# Patient Record
Sex: Female | Born: 1990 | Hispanic: Yes | Smoking: Never smoker
Health system: Southern US, Community
[De-identification: ages and names within clinical notes are randomized; demographics above are authoritative.]

## PROBLEM LIST (undated history)

## (undated) DIAGNOSIS — Z789 Other specified health status: Secondary | ICD-10-CM

## (undated) HISTORY — PX: NO PAST SURGERIES: SHX2092

## (undated) HISTORY — DX: Other specified health status: Z78.9

---

## 2014-01-20 ENCOUNTER — Other Ambulatory Visit: Payer: Self-pay | Admitting: Obstetrics & Gynecology

## 2014-01-20 DIAGNOSIS — O3680X0 Pregnancy with inconclusive fetal viability, not applicable or unspecified: Secondary | ICD-10-CM

## 2014-01-24 ENCOUNTER — Ambulatory Visit (INDEPENDENT_AMBULATORY_CARE_PROVIDER_SITE_OTHER): Payer: Medicaid Other

## 2014-01-24 DIAGNOSIS — O3680X0 Pregnancy with inconclusive fetal viability, not applicable or unspecified: Secondary | ICD-10-CM

## 2014-01-24 NOTE — Progress Notes (Signed)
U/S(9+0wks)-single IUP with +FCA noted, FHR-174 bpm, CRL c/w LMP dates, cx appears closed, bilateral adnexa appears WNL,

## 2014-02-01 ENCOUNTER — Encounter: Payer: Self-pay | Admitting: Women's Health

## 2014-02-01 ENCOUNTER — Ambulatory Visit (INDEPENDENT_AMBULATORY_CARE_PROVIDER_SITE_OTHER): Payer: Self-pay | Admitting: Women's Health

## 2014-02-01 VITALS — BP 128/78 | Ht 66.0 in | Wt 198.0 lb

## 2014-02-01 DIAGNOSIS — Z0189 Encounter for other specified special examinations: Secondary | ICD-10-CM

## 2014-02-01 DIAGNOSIS — Z1159 Encounter for screening for other viral diseases: Secondary | ICD-10-CM

## 2014-02-01 DIAGNOSIS — Z36 Encounter for antenatal screening of mother: Secondary | ICD-10-CM

## 2014-02-01 DIAGNOSIS — Z3401 Encounter for supervision of normal first pregnancy, first trimester: Secondary | ICD-10-CM

## 2014-02-01 DIAGNOSIS — Z1371 Encounter for nonprocreative screening for genetic disease carrier status: Secondary | ICD-10-CM

## 2014-02-01 DIAGNOSIS — Z130 Encounter for screening for diseases of the blood and blood-forming organs and certain disorders involving the immune mechanism: Secondary | ICD-10-CM

## 2014-02-01 DIAGNOSIS — Z0184 Encounter for antibody response examination: Secondary | ICD-10-CM

## 2014-02-01 DIAGNOSIS — Z1389 Encounter for screening for other disorder: Secondary | ICD-10-CM

## 2014-02-01 DIAGNOSIS — Z3400 Encounter for supervision of normal first pregnancy, unspecified trimester: Secondary | ICD-10-CM | POA: Insufficient documentation

## 2014-02-01 DIAGNOSIS — Z331 Pregnant state, incidental: Secondary | ICD-10-CM

## 2014-02-01 LAB — POCT URINALYSIS DIPSTICK
GLUCOSE UA: NEGATIVE
Ketones, UA: NEGATIVE
LEUKOCYTES UA: NEGATIVE
NITRITE UA: NEGATIVE
Protein, UA: NEGATIVE

## 2014-02-01 NOTE — Patient Instructions (Addendum)

## 2014-02-01 NOTE — Progress Notes (Signed)
  Subjective:  Bonnie Mayer is a 23 y.o. G1P0 Hispanic female at 6539w1d by LMP c/w 9wk u/s, being seen today for her first obstetrical visit.  Her obstetrical history is significant for primigravida.  Pregnancy history fully reviewed.  Patient reports n/v- declines meds at this time. Some dizziness and headaches. Had HAs prior to pregnancy, have become more frequent- hasn't tried any meds. Denies vb, cramping, uti s/s, abnormal/malodorous vag d/c, or vulvovaginal itching/irritation.  BP 128/78  Wt 198 lb (89.812 kg)  LMP 11/22/2013  HISTORY: OB History  Gravida Para Term Preterm AB SAB TAB Ectopic Multiple Living  1             # Outcome Date GA Lbr Len/2nd Weight Sex Delivery Anes PTL Lv  1 CUR              Past Medical History  Diagnosis Date  . Medical history non-contributory    Past Surgical History  Procedure Laterality Date  . No past surgeries     Family History  Problem Relation Age of Onset  . Diabetes Mother   . Hyperlipidemia Mother   . Hypertension Mother   . Cancer Paternal Uncle   . Alzheimer's disease Maternal Grandmother   . Diabetes Maternal Grandmother   . Hyperlipidemia Maternal Grandmother   . Hypertension Maternal Grandmother   . Diabetes Maternal Grandfather   . Hyperlipidemia Maternal Grandfather   . Hypertension Maternal Grandfather   . Cancer Paternal Grandmother     breast    Exam   System:     General: Well developed & nourished, no acute distress   Skin: Warm & dry, normal coloration and turgor, no rashes   Neurologic: Alert & oriented, normal mood   Cardiovascular: Regular rate & rhythm   Respiratory: Effort & rate normal, LCTAB, acyanotic   Abdomen: Soft, non tender   Extremities: normal strength, tone   [facility] per her report  FHR: 160  via informal u/s   Assessment:   Pregnancy: G1P0 Patient Active Problem List   Diagnosis Date Noted  . Supervision of normal first pregnancy 02/01/2014    Priority: High    139w1d G1P0 New OB visit N/V of pregnancy HAs Dizziness    Plan:  Self-pay, unable to pay for labs today- will try to come back before her next appt to have labs drawn. Applied for [employer] but was denied b/c she makes too much, doesn't have insurance w/ work- previously spoke w/ [facility] practitioner[facility] office staff- [facility] which we don't provide here Continue prenatal vitamins Problem list reviewed and updated Reviewed n/v relief measures and warning s/s to report, call back if decides she needs meds Reviewed recommended weight gain based on pre-gravid BMI Encouraged well-balanced diet Genetic Screening discussed Integrated Screen: requested, but self-pay, so decided to cancel Cystic fibrosis screening discussed declined- but self- pay, so decided to cancel Ultrasound discussed; fetal survey: requested Follow up in 4 weeks for visit [facility] completed [facility] prn HAs Increase po fluids, small frequent meals/snacks, change positions slowly  Marge DuncansBooker, Greydis Stlouis Randall CNM, WHNP-BC 02/01/2014 4:10 PM

## 2014-02-21 ENCOUNTER — Other Ambulatory Visit: Payer: Self-pay

## 2014-02-21 DIAGNOSIS — Z0184 Encounter for antibody response examination: Secondary | ICD-10-CM

## 2014-02-21 DIAGNOSIS — Z1389 Encounter for screening for other disorder: Secondary | ICD-10-CM

## 2014-02-21 DIAGNOSIS — Z118 Encounter for screening for other infectious and parasitic diseases: Secondary | ICD-10-CM

## 2014-02-21 DIAGNOSIS — Z331 Pregnant state, incidental: Secondary | ICD-10-CM

## 2014-02-21 DIAGNOSIS — Z0283 Encounter for blood-alcohol and blood-drug test: Secondary | ICD-10-CM

## 2014-02-21 DIAGNOSIS — Z113 Encounter for screening for infections with a predominantly sexual mode of transmission: Secondary | ICD-10-CM

## 2014-02-21 DIAGNOSIS — Z3401 Encounter for supervision of normal first pregnancy, first trimester: Secondary | ICD-10-CM

## 2014-02-21 DIAGNOSIS — Z130 Encounter for screening for diseases of the blood and blood-forming organs and certain disorders involving the immune mechanism: Secondary | ICD-10-CM

## 2014-02-21 DIAGNOSIS — Z1159 Encounter for screening for other viral diseases: Secondary | ICD-10-CM

## 2014-02-21 DIAGNOSIS — Z114 Encounter for screening for human immunodeficiency virus [HIV]: Secondary | ICD-10-CM

## 2014-02-21 NOTE — Addendum Note (Signed)
Addended by: Richardson ChiquitoRAVIS, [name] M on: 02/21/2014 02:55 PM   Modules accepted: Orders

## 2014-02-22 LAB — ABO AND RH: Rh Type: POSITIVE

## 2014-02-22 LAB — URINALYSIS
BILIRUBIN URINE: NEGATIVE
Glucose, UA: NEGATIVE mg/dL
Hgb urine dipstick: NEGATIVE
Ketones, ur: NEGATIVE mg/dL
Leukocytes, UA: NEGATIVE
NITRITE: NEGATIVE
PROTEIN: NEGATIVE mg/dL
SPECIFIC GRAVITY, URINE: 1.024 (ref 1.005–1.030)
UROBILINOGEN UA: 1 mg/dL (ref 0.0–1.0)
pH: 6 (ref 5.0–8.0)

## 2014-02-22 LAB — CBC
HEMATOCRIT: 39.2 % (ref 36.0–46.0)
Hemoglobin: 13 g/dL (ref 12.0–15.0)
MCH: 29.2 pg (ref 26.0–34.0)
MCHC: 33.2 g/dL (ref 30.0–36.0)
MCV: 88.1 fL (ref 78.0–100.0)
Platelets: 269 10*3/uL (ref 150–400)
RBC: 4.45 MIL/uL (ref 3.87–5.11)
RDW: 13.8 % (ref 11.5–15.5)
WBC: 8.3 10*3/uL (ref 4.0–10.5)

## 2014-02-22 LAB — HEPATITIS B SURFACE ANTIGEN: HEP B S AG: NEGATIVE

## 2014-02-22 LAB — ANTIBODY SCREEN: Antibody Screen: NEGATIVE

## 2014-02-22 LAB — RUBELLA SCREEN: RUBELLA: 0.73 {index} (ref <0.90)

## 2014-02-22 LAB — RPR

## 2014-02-22 LAB — HIV ANTIBODY (ROUTINE TESTING W REFLEX): HIV: NONREACTIVE

## 2014-02-22 LAB — GC/CHLAMYDIA PROBE AMP
CT PROBE, AMP APTIMA: NEGATIVE
GC PROBE AMP APTIMA: NEGATIVE

## 2014-02-22 LAB — VARICELLA ZOSTER ANTIBODY, IGG: Varicella IgG: 738.6 Index — ABNORMAL HIGH (ref <135.00)

## 2014-02-22 LAB — SICKLE CELL SCREEN: SICKLE CELL SCREEN: NEGATIVE

## 2014-02-22 NOTE — Addendum Note (Signed)
Addended by: Gaylyn RongEVANS, Raife Lizer A on: 02/22/2014 10:21 AM   Modules accepted: Orders

## 2014-03-01 ENCOUNTER — Ambulatory Visit (INDEPENDENT_AMBULATORY_CARE_PROVIDER_SITE_OTHER): Payer: Self-pay | Admitting: Women's Health

## 2014-03-01 ENCOUNTER — Encounter: Payer: Self-pay | Admitting: Women's Health

## 2014-03-01 VITALS — BP 112/68 | Wt 199.0 lb

## 2014-03-01 DIAGNOSIS — Z3400 Encounter for supervision of normal first pregnancy, unspecified trimester: Secondary | ICD-10-CM

## 2014-03-01 DIAGNOSIS — O9989 Other specified diseases and conditions complicating pregnancy, childbirth and the puerperium: Secondary | ICD-10-CM

## 2014-03-01 DIAGNOSIS — Z331 Pregnant state, incidental: Secondary | ICD-10-CM

## 2014-03-01 DIAGNOSIS — Z283 Underimmunization status: Secondary | ICD-10-CM

## 2014-03-01 DIAGNOSIS — Z3402 Encounter for supervision of normal first pregnancy, second trimester: Secondary | ICD-10-CM

## 2014-03-01 DIAGNOSIS — O09899 Supervision of other high risk pregnancies, unspecified trimester: Secondary | ICD-10-CM | POA: Insufficient documentation

## 2014-03-01 DIAGNOSIS — Z363 Encounter for antenatal screening for malformations: Secondary | ICD-10-CM

## 2014-03-01 DIAGNOSIS — Z1389 Encounter for screening for other disorder: Secondary | ICD-10-CM

## 2014-03-01 LAB — POCT URINALYSIS DIPSTICK
Blood, UA: NEGATIVE
GLUCOSE UA: NEGATIVE
Ketones, UA: NEGATIVE
LEUKOCYTES UA: NEGATIVE
NITRITE UA: NEGATIVE
Protein, UA: NEGATIVE

## 2014-03-01 MED ORDER — PRENATAL 27-0.8 MG PO TABS: 1.0000 | ORAL_TABLET | Freq: Every day | ORAL | Status: AC

## 2014-03-01 NOTE — Progress Notes (Signed)
Low-risk OB appointment G1P0 1029w1d Estimated Date of Delivery: 08/29/14 BP 112/68  Wt 199 lb (90.266 kg)  LMP 11/22/2013  BP, weight, and urine reviewed.  Refer to obstetrical flow sheet for FH & FHR.  No fm yet. Denies cramping, lof, vb, or uti s/s. No complaints. Reviewed warning s/s to report, pn1 labs. Plan:  Continue routine obstetrical care  F/U in 4-5wks for OB appointment and anatomy u/s

## 2014-03-01 NOTE — Patient Instructions (Signed)
Second Trimester of Pregnancy The second trimester is from week 13 through week 28, months 4 through 6. The second trimester is often a time when you feel your best. Your body has also adjusted to being pregnant, and you begin to feel better physically. Usually, morning sickness has lessened or quit completely, you may have more energy, and you may have an increase in appetite. The second trimester is also a time when the fetus is growing rapidly. At the end of the sixth month, the fetus is about 9 inches long and weighs about 1 pounds. You will likely begin to feel the baby move (quickening) between 18 and 20 weeks of the pregnancy. BODY CHANGES Your body goes through many changes during pregnancy. The changes vary from woman to woman.   Your weight will continue to increase. You will notice your lower abdomen bulging out.  You may begin to get stretch marks on your hips, abdomen, and breasts.  You may develop headaches that can be relieved by medicines approved by your health care provider.  You may urinate more often because the fetus is pressing on your bladder.  You may develop or continue to have heartburn as a result of your pregnancy.  You may develop constipation because certain hormones are causing the muscles that push waste through your intestines to slow down.  You may develop hemorrhoids or swollen, bulging veins (varicose veins).  You may have back pain because of the weight gain and pregnancy hormones relaxing your joints between the bones in your pelvis and as a result of a shift in weight and the muscles that support your balance.  Your breasts will continue to grow and be tender.  Your gums may bleed and may be sensitive to brushing and flossing.  Dark spots or blotches (chloasma, mask of pregnancy) may develop on your face. This will likely fade after the baby is born.  A dark line from your belly button to the pubic area (linea nigra) may appear. This will likely fade  after the baby is born.  You may have changes in your hair. These can include thickening of your hair, rapid growth, and changes in texture. Some women also have hair loss during or after pregnancy, or hair that feels dry or thin. Your hair will most likely return to normal after your baby is born. WHAT TO EXPECT AT YOUR PRENATAL VISITS During a routine prenatal visit:  You will be weighed to make sure you and the fetus are growing normally.  Your blood pressure will be taken.  Your abdomen will be measured to track your baby's growth.  The fetal heartbeat will be listened to.  Any test results from the previous visit will be discussed. Your health care provider may ask you:  How you are feeling.  If you are feeling the baby move.  If you have had any abnormal symptoms, such as leaking fluid, bleeding, severe headaches, or abdominal cramping.  If you have any questions. Other tests that may be performed during your second trimester include:  Blood tests that check for:  Low iron levels (anemia).  Gestational diabetes (between 24 and 28 weeks).  Rh antibodies.  Urine tests to check for infections, diabetes, or protein in the urine.  An ultrasound to confirm the proper growth and development of the baby.  An amniocentesis to check for possible genetic problems.  Fetal screens for spina bifida and Down syndrome. HOME CARE INSTRUCTIONS   Avoid all smoking, herbs, alcohol, and unprescribed   drugs. These chemicals affect the formation and growth of the baby.  Follow your health care provider's instructions regarding medicine use. There are medicines that are either safe or unsafe to take during pregnancy.  Exercise only as directed by your health care provider. Experiencing uterine cramps is a good sign to stop exercising.  Continue to eat regular, healthy meals.  Wear a good support bra for breast tenderness.  Do not use hot tubs, steam rooms, or saunas.  Wear your  seat belt at all times when driving.  Avoid raw meat, uncooked cheese, cat litter boxes, and soil used by cats. These carry germs that can cause birth defects in the baby.  Take your prenatal vitamins.  Try taking a stool softener (if your health care provider approves) if you develop constipation. Eat more high-fiber foods, such as fresh vegetables or fruit and whole grains. Drink plenty of fluids to keep your urine clear or pale yellow.  Take warm sitz baths to soothe any pain or discomfort caused by hemorrhoids. Use hemorrhoid cream if your health care provider approves.  If you develop varicose veins, wear support hose. Elevate your feet for 15 minutes, 3-4 times a day. Limit salt in your diet.  Avoid heavy lifting, wear low heel shoes, and practice good posture.  Rest with your legs elevated if you have leg cramps or low back pain.  Visit your dentist if you have not gone yet during your pregnancy. Use a soft toothbrush to brush your teeth and be gentle when you floss.  A sexual relationship may be continued unless your health care provider directs you otherwise.  Continue to go to all your prenatal visits as directed by your health care provider. SEEK MEDICAL CARE IF:   You have dizziness.  You have mild pelvic cramps, pelvic pressure, or nagging pain in the abdominal area.  You have persistent nausea, vomiting, or diarrhea.  You have a bad smelling vaginal discharge.  You have pain with urination. SEEK IMMEDIATE MEDICAL CARE IF:   You have a fever.  You are leaking fluid from your vagina.  You have spotting or bleeding from your vagina.  You have severe abdominal cramping or pain.  You have rapid weight gain or loss.  You have shortness of breath with chest pain.  You notice sudden or extreme swelling of your face, hands, ankles, feet, or legs.  You have not felt your baby move in over an hour.  You have severe headaches that do not go away with  medicine.  You have vision changes. Document Released: 04/16/2001 Document Revised: 04/27/2013 Document Reviewed: 06/23/2012 [employer] Patient Information 2015 [employer]. This information is not intended to replace advice given to you by your health care provider. Make sure you discuss any questions you have with your health care provider.  

## 2014-03-08 ENCOUNTER — Encounter: Payer: Self-pay | Admitting: Women's Health

## 2014-04-05 ENCOUNTER — Ambulatory Visit (INDEPENDENT_AMBULATORY_CARE_PROVIDER_SITE_OTHER): Payer: Self-pay | Admitting: Women's Health

## 2014-04-05 ENCOUNTER — Other Ambulatory Visit: Payer: Self-pay

## 2014-04-05 ENCOUNTER — Other Ambulatory Visit: Payer: Self-pay | Admitting: Women's Health

## 2014-04-05 ENCOUNTER — Ambulatory Visit (INDEPENDENT_AMBULATORY_CARE_PROVIDER_SITE_OTHER): Payer: Medicaid Other

## 2014-04-05 ENCOUNTER — Encounter: Payer: Self-pay | Admitting: Women's Health

## 2014-04-05 VITALS — BP 122/80 | Wt 203.0 lb

## 2014-04-05 DIAGNOSIS — O358XX1 Maternal care for other (suspected) fetal abnormality and damage, fetus 1: Secondary | ICD-10-CM

## 2014-04-05 DIAGNOSIS — Z1389 Encounter for screening for other disorder: Secondary | ICD-10-CM

## 2014-04-05 DIAGNOSIS — Z363 Encounter for antenatal screening for malformations: Secondary | ICD-10-CM

## 2014-04-05 DIAGNOSIS — Z36 Encounter for antenatal screening of mother: Secondary | ICD-10-CM

## 2014-04-05 DIAGNOSIS — Z331 Pregnant state, incidental: Secondary | ICD-10-CM

## 2014-04-05 DIAGNOSIS — O35BXX1 Maternal care for other (suspected) fetal abnormality and damage, fetal cardiac anomalies, fetus 1: Secondary | ICD-10-CM

## 2014-04-05 DIAGNOSIS — Z3402 Encounter for supervision of normal first pregnancy, second trimester: Secondary | ICD-10-CM

## 2014-04-05 LAB — POCT URINALYSIS DIPSTICK
GLUCOSE UA: NEGATIVE
Ketones, UA: NEGATIVE
Leukocytes, UA: NEGATIVE
NITRITE UA: NEGATIVE
Protein, UA: NEGATIVE
RBC UA: NEGATIVE

## 2014-04-05 NOTE — Patient Instructions (Addendum)
If you travel, make sure you get out of the car every hour to walk around to help prevent blood clots. If you are having any cramping, bleeding, leakage of fluid- do not go, call us or go to hospital.   [place] [facility]:  [facility] [phone]            [facility] [phone]                 [facility] [phone] (usually doesn't accept new patients unless you have family there already, you are always welcome to call and ask)             [facility] ([address] [place]) [phone]   [facility]:   [facility]: [phone]  [facility]: [phone]   Second Trimester of Pregnancy The second trimester is from week 13 through week 28, months 4 through 6. The second trimester is often a time when you feel your best. Your body has also adjusted to being pregnant, and you begin to feel better physically. Usually, morning sickness has lessened or quit completely, you may have more energy, and you may have an increase in appetite. The second trimester is also a time when the fetus is growing rapidly. At the end of the sixth month, the fetus is about 9 inches long and weighs about 1 pounds. You will likely begin to feel the baby move (quickening) between 18 and 20 weeks of the pregnancy. BODY CHANGES Your body goes through many changes during pregnancy. The changes vary from woman to woman.   Your weight will continue to increase. You will notice your lower abdomen bulging out.  You may begin to get stretch marks on your hips, abdomen, and breasts.  You may develop headaches that can be relieved by medicines approved by your health care provider.  You may urinate more often because the fetus is pressing on your bladder.  You may develop or continue to have heartburn as a result of your pregnancy.  You may develop constipation because certain hormones are  causing the muscles that push waste through your intestines to slow down.  You may develop hemorrhoids or swollen, bulging veins (varicose veins).  You may have back pain because of the weight gain and pregnancy hormones relaxing your joints between the bones in your pelvis and as a result of a shift in weight and the muscles that support your balance.  Your breasts will continue to grow and be tender.  Your gums may bleed and may be sensitive to brushing and flossing.  Dark spots or blotches (chloasma, mask of pregnancy) may develop on your face. This will likely fade after the baby is born.  A dark line from your belly button to the pubic area (linea nigra) may appear. This will likely fade after the baby is born.  You may have changes in your hair. These can include thickening of your hair, rapid growth, and changes in texture. Some women also have hair loss during or after pregnancy, or hair that feels dry or thin. Your hair will most likely return to normal after your baby is born. WHAT TO EXPECT AT YOUR PRENATAL VISITS During a routine prenatal visit:  You will be weighed to make sure you and the fetus are growing normally.  Your blood pressure will be taken.  Your abdomen will be measured to track your baby's growth.  The fetal heartbeat will be listened to.  Any test results from the previous visit will be discussed. Your health care provider may ask you:  How you are feeling.  If you are feeling the baby move.  If you have had any abnormal symptoms, such as leaking fluid, bleeding, severe headaches, or abdominal cramping.  If you have any questions. Other tests that may be performed during your second trimester include:  Blood tests that check for:  Low iron levels (anemia).  Gestational diabetes (between 24 and 28 weeks).  Rh antibodies.  Urine tests to check for infections, diabetes, or protein in the urine.  An ultrasound to confirm the proper growth and  development of the baby.  An amniocentesis to check for possible genetic problems.  Fetal screens for spina bifida and Down syndrome. HOME CARE INSTRUCTIONS   Avoid all smoking, herbs, alcohol, and unprescribed drugs. These chemicals affect the formation and growth of the baby.  Follow your health care provider's instructions regarding medicine use. There are medicines that are either safe or unsafe to take during pregnancy.  Exercise only as directed by your health care provider. Experiencing uterine cramps is a good sign to stop exercising.  Continue to eat regular, healthy meals.  Wear a good support bra for breast tenderness.  Do not use hot tubs, steam rooms, or saunas.  Wear your seat belt at all times when driving.  Avoid raw meat, uncooked cheese, cat litter boxes, and soil used by cats. These carry germs that can cause birth defects in the baby.  Take your prenatal vitamins.  Try taking a stool softener (if your health care provider approves) if you develop constipation. Eat more high-fiber foods, such as fresh vegetables or fruit and whole grains. Drink plenty of fluids to keep your urine clear or pale yellow.  Take warm sitz baths to soothe any pain or discomfort caused by hemorrhoids. Use hemorrhoid cream if your health care provider approves.  If you develop varicose veins, wear support hose. Elevate your feet for 15 minutes, 3-4 times a day. Limit salt in your diet.  Avoid heavy lifting, wear low heel shoes, and practice good posture.  Rest with your legs elevated if you have leg cramps or low back pain.  Visit your dentist if you have not gone yet during your pregnancy. Use a soft toothbrush to brush your teeth and be gentle when you floss.  A sexual relationship may be continued unless your health care provider directs you otherwise.  Continue to go to all your prenatal visits as directed by your health care provider. SEEK MEDICAL CARE IF:   You have  dizziness.  You have mild pelvic cramps, pelvic pressure, or nagging pain in the abdominal area.  You have persistent nausea, vomiting, or diarrhea.  You have a bad smelling vaginal discharge.  You have pain with urination. SEEK IMMEDIATE MEDICAL CARE IF:   You have a fever.  You are leaking fluid from your vagina.  You have spotting or bleeding from your vagina.  You have severe abdominal cramping or pain.  You have rapid weight gain or loss.  You have shortness of breath with chest pain.  You notice sudden or extreme swelling of your face, hands, ankles, feet, or legs.  You have not felt your baby move in over an hour.  You have severe headaches that do not go away with medicine.  You have vision changes. Document Released: 04/16/2001 Document Revised: 04/27/2013 Document Reviewed: 06/23/2012 [facility] Patient Information 2015 [place], [place]. This information is not  intended to replace advice given to you by your health care provider. Make sure you discuss any questions you have with your health care provider.

## 2014-04-05 NOTE — Progress Notes (Signed)
U/S(19+1wks)-active fetus, meas c/w dates, fluid WNL, fundal Gr 0 placenta, cx appears closed (3.7cm), bilateral adnexa appears WNL,FHR-141 bpm, female fetus, LT Vent EICF noted no other major abnl noted

## 2014-04-05 NOTE — Progress Notes (Signed)
Low-risk OB appointment G1P0 10922w1d Estimated Date of Delivery: 08/29/14 BP 122/80 mmHg  Wt 203 lb (92.08 kg)  LMP 11/22/2013  BP, weight, and urine reviewed.  Refer to obstetrical flow sheet for FH & FHR.  Reports good fm.  Denies regular uc's, lof, vb, or uti s/s. No complaints. Planning on traveling to [place] via car for Christmas for ~1wk. To take copy of records. Do not go if sx ptl. Get out q 1hr to walk, do leg exercises while in car.  Reviewed today's anatomy u/s including Lt EICF- will recheck @ 28wks. Discussed ptl s/s, fm.  Plan:  Continue routine obstetrical care  F/U in 4wks for OB appointment

## 2014-05-06 NOTE — L&D Delivery Note (Cosign Needed)
Delivery Note After a 2 hour 2nd stage, At 1:00 AM a viable female was delivered via Vaginal, Spontaneous Delivery (Presentation: ;LAO  ).  APGAR: 8, 9; weight pending.  40 units of pitocin diluted in 1000cc LR was infused rapidly IV.  THe pt started bleeding and the placenta still had not separated. Cytotec 800mcg was given PR. The placenta separated spontaneously and delivered via CCT and maternal pushing effort.  It was inspected and appears to be intact with a 3 VC. The fundus was slow to firm despite massage, so methergine 0.2mg  was given IM (BP recorded was with arm bent; recheck before administration normal).   Anesthesia: Epidural  Episiotomy:  none Lacerations:  1st degree perineal Suture Repair: 2.0 vicryl Est. Blood Loss (mL):  500  Mom to postpartum.  Baby to Couplet care / Skin to Skin.  CRESENZO-DISHMAN,Jasper Hanf 08/19/2014, 1:30 AM

## 2014-05-09 ENCOUNTER — Ambulatory Visit (INDEPENDENT_AMBULATORY_CARE_PROVIDER_SITE_OTHER): Payer: Medicaid Other | Admitting: Women's Health

## 2014-05-09 ENCOUNTER — Encounter: Payer: Self-pay | Admitting: Women's Health

## 2014-05-09 VITALS — BP 120/80 | Wt 210.0 lb

## 2014-05-09 DIAGNOSIS — Z1389 Encounter for screening for other disorder: Secondary | ICD-10-CM

## 2014-05-09 DIAGNOSIS — O283 Abnormal ultrasonic finding on antenatal screening of mother: Secondary | ICD-10-CM

## 2014-05-09 DIAGNOSIS — Z331 Pregnant state, incidental: Secondary | ICD-10-CM

## 2014-05-09 DIAGNOSIS — Z3402 Encounter for supervision of normal first pregnancy, second trimester: Secondary | ICD-10-CM

## 2014-05-09 LAB — POCT URINALYSIS DIPSTICK
GLUCOSE UA: NEGATIVE
KETONES UA: NEGATIVE
LEUKOCYTES UA: NEGATIVE
NITRITE UA: NEGATIVE

## 2014-05-09 NOTE — Patient Instructions (Signed)
You will have your sugar test next visit.  Please do not eat or drink anything after midnight the night before you come, not even water.  You will be here for at least two hours.     Call the office ([phone]) or go to [facility] if:  You begin to have strong, frequent contractions  Your water breaks.  Sometimes it is a big gush of fluid, sometimes it is just a trickle that keeps getting your panties wet or running down your legs  You have vaginal bleeding.  It is normal to have a small amount of spotting if your cervix was checked.   You don't feel your baby moving like normal.  If you don't, get you something to eat and drink and lay down and focus on feeling your baby move.   If your baby is still not moving like normal, you should call the office or go to [facility].  Second Trimester of Pregnancy The second trimester is from week 13 through week 28, months 4 through 6. The second trimester is often a time when you feel your best. Your body has also adjusted to being pregnant, and you begin to feel better physically. Usually, morning sickness has lessened or quit completely, you may have more energy, and you may have an increase in appetite. The second trimester is also a time when the fetus is growing rapidly. At the end of the sixth month, the fetus is about 9 inches long and weighs about 1 pounds. You will likely begin to feel the baby move (quickening) between 18 and 20 weeks of the pregnancy. BODY CHANGES Your body goes through many changes during pregnancy. The changes vary from woman to woman.   Your weight will continue to increase. You will notice your lower abdomen bulging out.  You may begin to get stretch marks on your hips, abdomen, and breasts.  You may develop headaches that can be relieved by medicines approved by your health care provider.  You may urinate more often because the fetus is pressing on your bladder.  You may develop or continue to have  heartburn as a result of your pregnancy.  You may develop constipation because certain hormones are causing the muscles that push waste through your intestines to slow down.  You may develop hemorrhoids or swollen, bulging veins (varicose veins).  You may have back pain because of the weight gain and pregnancy hormones relaxing your joints between the bones in your pelvis and as a result of a shift in weight and the muscles that support your balance.  Your breasts will continue to grow and be tender.  Your gums may bleed and may be sensitive to brushing and flossing.  Dark spots or blotches (chloasma, mask of pregnancy) may develop on your face. This will likely fade after the baby is born.  A dark line from your belly button to the pubic area (linea nigra) may appear. This will likely fade after the baby is born.  You may have changes in your hair. These can include thickening of your hair, rapid growth, and changes in texture. Some women also have hair loss during or after pregnancy, or hair that feels dry or thin. Your hair will most likely return to normal after your baby is born. WHAT TO EXPECT AT YOUR PRENATAL VISITS During a routine prenatal visit:  You will be weighed to make sure you and the fetus are growing normally.  Your blood pressure will be taken.    Your abdomen will be measured to track your baby's growth.  The fetal heartbeat will be listened to.  Any test results from the previous visit will be discussed. Your health care provider may ask you:  How you are feeling.  If you are feeling the baby move.  If you have had any abnormal symptoms, such as leaking fluid, bleeding, severe headaches, or abdominal cramping.  If you have any questions. Other tests that may be performed during your second trimester include:  Blood tests that check for:  Low iron levels (anemia).  Gestational diabetes (between 24 and 28 weeks).  Rh antibodies.  Urine tests to check  for infections, diabetes, or protein in the urine.  An ultrasound to confirm the proper growth and development of the baby.  An amniocentesis to check for possible genetic problems.  Fetal screens for spina bifida and Down syndrome. HOME CARE INSTRUCTIONS   Avoid all smoking, herbs, alcohol, and unprescribed drugs. These chemicals affect the formation and growth of the baby.  Follow your health care provider's instructions regarding medicine use. There are medicines that are either safe or unsafe to take during pregnancy.  Exercise only as directed by your health care provider. Experiencing uterine cramps is a good sign to stop exercising.  Continue to eat regular, healthy meals.  Wear a good support bra for breast tenderness.  Do not use hot tubs, steam rooms, or saunas.  Wear your seat belt at all times when driving.  Avoid raw meat, uncooked cheese, cat litter boxes, and soil used by cats. These carry germs that can cause birth defects in the baby.  Take your prenatal vitamins.  Try taking a stool softener (if your health care provider approves) if you develop constipation. Eat more high-fiber foods, such as fresh vegetables or fruit and whole grains. Drink plenty of fluids to keep your urine clear or pale yellow.  Take warm sitz baths to soothe any pain or discomfort caused by hemorrhoids. Use hemorrhoid cream if your health care provider approves.  If you develop varicose veins, wear support hose. Elevate your feet for 15 minutes, 3-4 times a day. Limit salt in your diet.  Avoid heavy lifting, wear low heel shoes, and practice good posture.  Rest with your legs elevated if you have leg cramps or low back pain.  Visit your dentist if you have not gone yet during your pregnancy. Use a soft toothbrush to brush your teeth and be gentle when you floss.  A sexual relationship may be continued unless your health care provider directs you otherwise.  Continue to go to all your  prenatal visits as directed by your health care provider. SEEK MEDICAL CARE IF:   You have dizziness.  You have mild pelvic cramps, pelvic pressure, or nagging pain in the abdominal area.  You have persistent nausea, vomiting, or diarrhea.  You have a bad smelling vaginal discharge.  You have pain with urination. SEEK IMMEDIATE MEDICAL CARE IF:   You have a fever.  You are leaking fluid from your vagina.  You have spotting or bleeding from your vagina.  You have severe abdominal cramping or pain.  You have rapid weight gain or loss.  You have shortness of breath with chest pain.  You notice sudden or extreme swelling of your face, hands, ankles, feet, or legs.  You have not felt your baby move in over an hour.  You have severe headaches that do not go away with medicine.  You have vision changes.   Document Released: 04/16/2001 Document Revised: 04/27/2013 Document Reviewed: 06/23/2012 Exit[employer] Patient Information 2015 [employer]. This information is not intended to replace advice given to you by your health care provider. Make sure you discuss any questions you have with your health care provider.     

## 2014-05-09 NOTE — Progress Notes (Signed)
Low-risk OB appointment G1P0 [redacted]w[redacted]d Estimated Date of Delivery: 08/29/14 BP 120/80 mmHg  Wt 210 lb (95.255 kg)  LMP 11/22/2013  BP, weight, and urine reviewed.  Refer to obstetrical flow sheet for FH & FHR.  Reports good fm.  Denies regular uc's, lof, vb, or uti s/s. No complaints. Reviewed ptl s/s, fm. Plan:  Continue routine obstetrical care  F/U in 4wks for OB appointment, pn2, and repeat u/s for Lt EICF

## 2014-06-06 ENCOUNTER — Ambulatory Visit (INDEPENDENT_AMBULATORY_CARE_PROVIDER_SITE_OTHER): Payer: Medicaid Other | Admitting: Obstetrics and Gynecology

## 2014-06-06 ENCOUNTER — Encounter: Payer: Self-pay | Admitting: Obstetrics and Gynecology

## 2014-06-06 ENCOUNTER — Other Ambulatory Visit: Payer: Medicaid Other

## 2014-06-06 ENCOUNTER — Ambulatory Visit (INDEPENDENT_AMBULATORY_CARE_PROVIDER_SITE_OTHER): Payer: Medicaid Other

## 2014-06-06 DIAGNOSIS — Z331 Pregnant state, incidental: Secondary | ICD-10-CM

## 2014-06-06 DIAGNOSIS — Z113 Encounter for screening for infections with a predominantly sexual mode of transmission: Secondary | ICD-10-CM

## 2014-06-06 DIAGNOSIS — O283 Abnormal ultrasonic finding on antenatal screening of mother: Secondary | ICD-10-CM

## 2014-06-06 DIAGNOSIS — Z3403 Encounter for supervision of normal first pregnancy, third trimester: Secondary | ICD-10-CM

## 2014-06-06 DIAGNOSIS — Z1389 Encounter for screening for other disorder: Secondary | ICD-10-CM

## 2014-06-06 DIAGNOSIS — Z114 Encounter for screening for human immunodeficiency virus [HIV]: Secondary | ICD-10-CM

## 2014-06-06 DIAGNOSIS — Z0184 Encounter for antibody response examination: Secondary | ICD-10-CM

## 2014-06-06 DIAGNOSIS — Z131 Encounter for screening for diabetes mellitus: Secondary | ICD-10-CM

## 2014-06-06 LAB — POCT URINALYSIS DIPSTICK
Glucose, UA: NEGATIVE
Ketones, UA: NEGATIVE
Leukocytes, UA: NEGATIVE
Nitrite, UA: NEGATIVE
PROTEIN UA: NEGATIVE
RBC UA: NEGATIVE

## 2014-06-06 NOTE — Progress Notes (Signed)
U/S(28+0wks)-vtx active fetus, EFW 2 lb 12 oz (60th%tile), fluid WNL, fundal Gr 1 placenta, female fetus, FHR- 153 bpm, Lt Vent EICF remains, although all other cardiac structures appear  WNL

## 2014-06-06 NOTE — Progress Notes (Signed)
Pt denies any problems or concerns at this time.  

## 2014-06-06 NOTE — Progress Notes (Signed)
Patient ID: Bonnie Mayer, female   DOB: [birth year], 24 y.o.   MRN: [id number] G1P0 1667w0d Estimated Date of Delivery: 08/29/14  Blood pressure 120/80, weight 215 lb (97.523 kg), last menstrual period 11/22/2013.   refer to the ob flow sheet for FH and FHR, also BP, Wt, Urine results: negative  Patient reports +good fetal movement, denies any bleeding and no rupture of membranes symptoms or regular contractions. Patient complaints: She does not have any complaints at this time.  FH - 29cm FHR -153  Questions were answered. Assessment: 3967w0d, G1P0  Plan:  Continued routine obstetrical care F/u in 4 weeks   This chart was scribed for Bonnie BurrowJohn Samantha Olivera V, MD by Carl Bestelina Holson, Bonnie Mayer. This patient was seen in Room 1 and the patient's care was started at 10:27 AM.

## 2014-06-08 ENCOUNTER — Encounter: Payer: Self-pay | Admitting: Women's Health

## 2014-06-08 DIAGNOSIS — O283 Abnormal ultrasonic finding on antenatal screening of mother: Secondary | ICD-10-CM | POA: Insufficient documentation

## 2014-06-08 LAB — CBC
HCT: 35.7 % (ref 34.0–46.6)
HEMOGLOBIN: 12 g/dL (ref 11.1–15.9)
MCH: 30.5 pg (ref 26.6–33.0)
MCHC: 33.6 g/dL (ref 31.5–35.7)
MCV: 91 fL (ref 79–97)
Platelets: 244 10*3/uL (ref 150–379)
RBC: 3.94 x10E6/uL (ref 3.77–5.28)
RDW: 14 % (ref 12.3–15.4)
WBC: 10.4 10*3/uL (ref 3.4–10.8)

## 2014-06-08 LAB — GLUCOSE TOLERANCE, 2 HOURS W/ 1HR
Glucose, 1 hour: 148 mg/dL (ref 65–179)
Glucose, 2 hour: 91 mg/dL (ref 65–152)
Glucose, Fasting: 80 mg/dL (ref 65–91)

## 2014-06-08 LAB — HIV ANTIBODY (ROUTINE TESTING W REFLEX): HIV SCREEN 4TH GENERATION: NONREACTIVE

## 2014-06-08 LAB — HSV 2 ANTIBODY, IGG: HSV 2 IGG,TYPE SPECIFIC AB: <0.91 index (ref 0.00–0.90)

## 2014-06-08 LAB — RPR: RPR Ser Ql: NONREACTIVE

## 2014-06-08 LAB — ANTIBODY SCREEN: ANTIBODY SCREEN: NEGATIVE

## 2014-07-04 ENCOUNTER — Ambulatory Visit (INDEPENDENT_AMBULATORY_CARE_PROVIDER_SITE_OTHER): Payer: Medicaid Other | Admitting: Obstetrics and Gynecology

## 2014-07-04 ENCOUNTER — Encounter: Payer: Self-pay | Admitting: Obstetrics and Gynecology

## 2014-07-04 DIAGNOSIS — Z331 Pregnant state, incidental: Secondary | ICD-10-CM

## 2014-07-04 DIAGNOSIS — Z1389 Encounter for screening for other disorder: Secondary | ICD-10-CM

## 2014-07-04 DIAGNOSIS — Z3403 Encounter for supervision of normal first pregnancy, third trimester: Secondary | ICD-10-CM

## 2014-07-04 LAB — POCT URINALYSIS DIPSTICK
Blood, UA: NEGATIVE
Glucose, UA: NEGATIVE
Ketones, UA: NEGATIVE
Leukocytes, UA: NEGATIVE
NITRITE UA: NEGATIVE

## 2014-07-04 NOTE — Progress Notes (Signed)
Pt states that when she walks a lot she gets pain in her lower right groin area.

## 2014-07-04 NOTE — Progress Notes (Signed)
Patient ID: Bonnie Mayer, female   DOB: [birth year], 24 y.o.   MRN: [id number]  G1P0 3653w0d Estimated Date of Delivery: 08/29/14  Blood pressure 108/70, pulse 90, weight 222 lb 8 oz (100.925 kg), last menstrual period 11/22/2013.   refer to the ob flow sheet for FH and FHR, also BP, Wt, Urine results:notable for negative  Patient reports  + good fetal movement, denies any bleeding and no rupture of membranes symptoms or regular contractions. Patient complaints: Pt reports pain in right-sided suprapubic region with ambulation. She denies vaginal bleeding, vaginal discharge and contractions.   FH: 34 cm FHR: 162 bpm  Questions were answered. Assessment: 653w0d, G1P0  Plan:  Continued routine obstetrical care  F/u in 2 weeks for routine care   This chart was scribed for Tilda BurrowJohn Irena Gaydos V, MD by Gwenyth Oberatherine Macek, ED Scribe. This patient was seen in room 1 and the patient's care was started at 2:05 PM.   I personally performed the services described in this documentation, which was SCRIBED in my presence. The recorded information has been reviewed and considered accurate. It has been edited as necessary during review. Tilda BurrowFERGUSON,Devinne Epstein V, MD

## 2014-07-19 ENCOUNTER — Ambulatory Visit (INDEPENDENT_AMBULATORY_CARE_PROVIDER_SITE_OTHER): Payer: Medicaid Other | Admitting: Advanced Practice Midwife

## 2014-07-19 ENCOUNTER — Encounter: Payer: Self-pay | Admitting: Advanced Practice Midwife

## 2014-07-19 VITALS — BP 110/60 | HR 92 | Wt 226.0 lb

## 2014-07-19 DIAGNOSIS — Z1389 Encounter for screening for other disorder: Secondary | ICD-10-CM

## 2014-07-19 DIAGNOSIS — Z3403 Encounter for supervision of normal first pregnancy, third trimester: Secondary | ICD-10-CM

## 2014-07-19 DIAGNOSIS — Z331 Pregnant state, incidental: Secondary | ICD-10-CM

## 2014-07-19 LAB — POCT URINALYSIS DIPSTICK
Glucose, UA: NEGATIVE
Ketones, UA: NEGATIVE
Leukocytes, UA: NEGATIVE
Nitrite, UA: NEGATIVE
RBC UA: NEGATIVE

## 2014-07-19 NOTE — Progress Notes (Signed)
G1P0 79103w1d Estimated Date of Delivery: 08/29/14  Blood pressure 110/60, pulse 92, weight 226 lb (102.513 kg), last menstrual period 11/22/2013.   BP weight and urine results all reviewed and noted.  Please refer to the obstetrical flow sheet for the fundal height and fetal heart rate documentation:  Patient reports good fetal movement, denies any bleeding and no rupture of membranes symptoms or regular contractions. Patient is without complaints other than normal pregnancy complaints.  All questions were answered.  Plan:  Continued routine obstetrical care,   Follow up in 2 weeks for OB appointment,

## 2014-07-19 NOTE — Progress Notes (Signed)
Pt denies any problems or concerns at this time.  

## 2014-07-19 NOTE — Patient Instructions (Signed)

## 2014-08-02 ENCOUNTER — Ambulatory Visit (INDEPENDENT_AMBULATORY_CARE_PROVIDER_SITE_OTHER): Payer: Medicaid Other | Admitting: Women's Health

## 2014-08-02 VITALS — BP 110/70 | HR 76 | Wt 228.0 lb

## 2014-08-02 DIAGNOSIS — Z331 Pregnant state, incidental: Secondary | ICD-10-CM

## 2014-08-02 DIAGNOSIS — Z1389 Encounter for screening for other disorder: Secondary | ICD-10-CM

## 2014-08-02 DIAGNOSIS — Z3403 Encounter for supervision of normal first pregnancy, third trimester: Secondary | ICD-10-CM

## 2014-08-02 LAB — POCT URINALYSIS DIPSTICK
Blood, UA: NEGATIVE
Glucose, UA: NEGATIVE
KETONES UA: NEGATIVE
Leukocytes, UA: NEGATIVE
Nitrite, UA: NEGATIVE

## 2014-08-02 NOTE — Progress Notes (Signed)
Low-risk OB appointment G1P0 7279w1d Estimated Date of Delivery: 08/29/14 BP 110/70 mmHg  Pulse 76  Wt 228 lb (103.42 kg)  LMP 11/22/2013  BP, weight, and urine reviewed.  Refer to obstetrical flow sheet for FH & FHR.  Reports good fm.  Denies regular uc's, lof, vb, or uti s/s. No complaints. Reviewed ptl s/s, fkc. Plan:  Continue routine obstetrical care  F/U in 1wk for OB appointment and gbs

## 2014-08-02 NOTE — Patient Instructions (Addendum)
[place] Pediatricians/Family Doctors:  [facility] [phone]            [facility] [phone]                 [facility] [phone] (usually not accepting new patients unless you have family there already, you are always welcome to call and ask)            [facility] [phone] [address] [place]) [phone]   [facility] Pediatricians/Family Doctors:   [facility]: [phone]  [facility]: [phone]    Call the office [phone]) or go to [facility] if:  You begin to have strong, frequent contractions  Your water breaks.  Sometimes it is a big gush of fluid, sometimes it is just a trickle that keeps getting your panties wet or running down your legs  You have vaginal bleeding.  It is normal to have a small amount of spotting if your cervix was checked.   You don't feel your baby moving like normal.  If you don't, get you something to eat and drink and lay down and focus on feeling your baby move.  You should feel at least 10 movements in 2 hours.  If you don't, you should call the office or go to [facility].    Preterm Labor Information Preterm labor is when labor starts at less than 37 weeks of pregnancy. The normal length of a pregnancy is 39 to 41 weeks. CAUSES Often, there is no identifiable underlying cause as to why a woman goes into preterm labor. One of the most common known causes of preterm labor is infection. Infections of the uterus, cervix, vagina, amniotic sac, bladder, kidney, or even the lungs (pneumonia) can cause labor to start. Other suspected causes of preterm labor include:  6. Urogenital infections, such as yeast infections and bacterial vaginosis.  7. Uterine abnormalities (uterine shape, uterine septum, fibroids, or bleeding from the placenta).  8. A cervix that has been operated on (it may fail to stay closed).  9. Malformations in the fetus.   10. Multiple gestations (twins, triplets, and so on).  11. Breakage of the amniotic sac.  RISK FACTORS 2. Having a previous history of preterm labor.  3. Having premature rupture of membranes (PROM).  4. Having a placenta that covers the opening of the cervix (placenta previa).  5. Having a placenta that separates from the uterus (placental abruption).  6. Having a cervix that is too weak to hold the fetus in the uterus (incompetent cervix).  7. Having too much fluid in the amniotic sac (polyhydramnios).  8. Taking illegal drugs or smoking while pregnant.  9. Not gaining enough weight while pregnant.  10. Being younger than 45 and older than 26 years old.  11. Having a low socioeconomic status.  12. Being African American. SYMPTOMS Signs and symptoms of preterm labor include:  2. Menstrual-like cramps, abdominal pain, or back pain. 3. Uterine contractions that are regular, as frequent as six in an hour, regardless of their intensity (may be mild or painful). 4. Contractions that start on the top of the uterus and spread down to the lower abdomen and back.  5. A sense of increased pelvic pressure.  6. A watery or bloody mucus discharge that comes from the vagina.  TREATMENT Depending on the length of the pregnancy and other circumstances, your health care provider may suggest bed rest. If necessary, there are medicines that can be given to stop contractions  and to mature the fetal lungs. If labor happens before 34 weeks of pregnancy, a prolonged hospital stay may be recommended. Treatment depends on the condition of both you and the fetus.  WHAT SHOULD YOU DO IF YOU THINK YOU ARE IN PRETERM LABOR? Call your health care provider right away. You will need to go to the hospital to get checked immediately. HOW CAN YOU PREVENT PRETERM LABOR IN FUTURE PREGNANCIES? You should:  2. Stop smoking if you smoke. 3. Maintain healthy weight gain and avoid chemicals and drugs that are  not necessary. 4. Be watchful for any type of infection. 5. Inform your health care provider if you have a known history of preterm labor. Document Released: 07/13/2003 Document Revised: 12/23/2012 Document Reviewed: 05/25/2012 [facility] Patient Information 2015 [place], [place]. This information is not intended to replace advice given to you by your health care provider. Make sure you discuss any questions you have with your health care provider.

## 2014-08-09 ENCOUNTER — Ambulatory Visit (INDEPENDENT_AMBULATORY_CARE_PROVIDER_SITE_OTHER): Payer: Medicaid Other | Admitting: Obstetrics and Gynecology

## 2014-08-09 VITALS — BP 100/60 | HR 84 | Wt 229.0 lb

## 2014-08-09 DIAGNOSIS — Z369 Encounter for antenatal screening, unspecified: Secondary | ICD-10-CM

## 2014-08-09 DIAGNOSIS — Z3403 Encounter for supervision of normal first pregnancy, third trimester: Secondary | ICD-10-CM

## 2014-08-09 DIAGNOSIS — Z1389 Encounter for screening for other disorder: Secondary | ICD-10-CM

## 2014-08-09 DIAGNOSIS — Z331 Pregnant state, incidental: Secondary | ICD-10-CM

## 2014-08-09 LAB — POCT URINALYSIS DIPSTICK
Blood, UA: NEGATIVE
GLUCOSE UA: NEGATIVE
KETONES UA: NEGATIVE
LEUKOCYTES UA: NEGATIVE
NITRITE UA: NEGATIVE
Protein, UA: NEGATIVE

## 2014-08-09 LAB — OB RESULTS CONSOLE GC/CHLAMYDIA
Chlamydia: NEGATIVE
GC PROBE AMP, GENITAL: NEGATIVE

## 2014-08-09 LAB — OB RESULTS CONSOLE GBS: GBS: NEGATIVE

## 2014-08-09 NOTE — Patient Instructions (Signed)
kiFetal Movement Counts Patient Name: __________________________________________________ Patient Due Date: ____________________ Performing a fetal movement count is highly recommended in high-risk pregnancies, but it is good for every pregnant woman to do. Your health care provider may ask you to start counting fetal movements at 28 weeks of the pregnancy. Fetal movements often increase:  After eating a full meal.  After physical activity.  After eating or drinking something sweet or cold.  At rest. Pay attention to when you feel the baby is most active. This will help you notice a pattern of your baby's sleep and wake cycles and what factors contribute to an increase in fetal movement. It is important to perform a fetal movement count at the same time each day when your baby is normally most active.  HOW TO COUNT FETAL MOVEMENTS 1. Find a quiet and comfortable area to sit or lie down on your left side. Lying on your left side provides the best blood and oxygen circulation to your baby. 2. Write down the day and time on a sheet of paper or in a journal. 3. Start counting kicks, flutters, swishes, rolls, or jabs in a 2-hour period. You should feel at least 10 movements within 2 hours. 4. If you do not feel 10 movements in 2 hours, wait 2-3 hours and count again. Look for a change in the pattern or not enough counts in 2 hours. SEEK MEDICAL CARE IF:  You feel less than 10 counts in 2 hours, tried twice.  There is no movement in over an hour.  The pattern is changing or taking longer each day to reach 10 counts in 2 hours.  You feel the baby is not moving as he or she usually does. Date: ____________ Movements: ____________ Start time: ____________ Bonnie MartinFinish time: ____________  Date: ____________ Movements: ____________ Start time: ____________ Bonnie MartinFinish time: ____________ Date: ____________ Movements: ____________ Start time: ____________ Bonnie MartinFinish time: ____________ Date: ____________ Movements:  ____________ Start time: ____________ Bonnie MartinFinish time: ____________ Date: ____________ Movements: ____________ Start time: ____________ Bonnie MartinFinish time: ____________ Date: ____________ Movements: ____________ Start time: ____________ Bonnie MartinFinish time: ____________ Date: ____________ Movements: ____________ Start time: ____________ Bonnie MartinFinish time: ____________ Date: ____________ Movements: ____________ Start time: ____________ Bonnie MartinFinish time: ____________  Date: ____________ Movements: ____________ Start time: ____________ Bonnie MartinFinish time: ____________ Date: ____________ Movements: ____________ Start time: ____________ Bonnie MartinFinish time: ____________ Date: ____________ Movements: ____________ Start time: ____________ Bonnie MartinFinish time: ____________ Date: ____________ Movements: ____________ Start time: ____________ Bonnie MartinFinish time: ____________ Date: ____________ Movements: ____________ Start time: ____________ Bonnie MartinFinish time: ____________ Date: ____________ Movements: ____________ Start time: ____________ Bonnie MartinFinish time: ____________ Date: ____________ Movements: ____________ Start time: ____________ Bonnie MartinFinish time: ____________  Date: ____________ Movements: ____________ Start time: ____________ Bonnie MartinFinish time: ____________ Date: ____________ Movements: ____________ Start time: ____________ Bonnie MartinFinish time: ____________ Date: ____________ Movements: ____________ Start time: ____________ Bonnie MartinFinish time: ____________ Date: ____________ Movements: ____________ Start time: ____________ Bonnie MartinFinish time: ____________ Date: ____________ Movements: ____________ Start time: ____________ Bonnie MartinFinish time: ____________ Date: ____________ Movements: ____________ Start time: ____________ Bonnie MartinFinish time: ____________ Date: ____________ Movements: ____________ Start time: ____________ Bonnie MartinFinish time: ____________  Date: ____________ Movements: ____________ Start time: ____________ Bonnie MartinFinish time: ____________ Date: ____________ Movements: ____________ Start time: ____________ Bonnie MartinFinish  time: ____________ Date: ____________ Movements: ____________ Start time: ____________ Bonnie MartinFinish time: ____________ Date: ____________ Movements: ____________ Start time: ____________ Bonnie MartinFinish time: ____________ Date: ____________ Movements: ____________ Start time: ____________ Bonnie MartinFinish time: ____________ Date: ____________ Movements: ____________ Start time: ____________ Bonnie MartinFinish time: ____________ Date: ____________ Movements: ____________ Start time: ____________ Bonnie MartinFinish time: ____________  Date: ____________ Movements: ____________ Start time: ____________ Bonnie MartinFinish  time: ____________ Date: ____________ Movements: ____________ Start time: ____________ Bonnie Mayer time: ____________ Date: ____________ Movements: ____________ Start time: ____________ Bonnie Mayer time: ____________ Date: ____________ Movements: ____________ Start time: ____________ Bonnie Mayer time: ____________ Date: ____________ Movements: ____________ Start time: ____________ Bonnie Mayer time: ____________ Date: ____________ Movements: ____________ Start time: ____________ Bonnie Mayer time: ____________ Date: ____________ Movements: ____________ Start time: ____________ Bonnie Mayer time: ____________  Date: ____________ Movements: ____________ Start time: ____________ Bonnie Mayer time: ____________ Date: ____________ Movements: ____________ Start time: ____________ Bonnie Mayer time: ____________ Date: ____________ Movements: ____________ Start time: ____________ Bonnie Mayer time: ____________ Date: ____________ Movements: ____________ Start time: ____________ Bonnie Mayer time: ____________ Date: ____________ Movements: ____________ Start time: ____________ Bonnie Mayer time: ____________ Date: ____________ Movements: ____________ Start time: ____________ Bonnie Mayer time: ____________ Date: ____________ Movements: ____________ Start time: ____________ Bonnie Mayer time: ____________  Date: ____________ Movements: ____________ Start time: ____________ Bonnie Mayer time: ____________ Date: ____________  Movements: ____________ Start time: ____________ Bonnie Mayer time: ____________ Date: ____________ Movements: ____________ Start time: ____________ Bonnie Mayer time: ____________ Date: ____________ Movements: ____________ Start time: ____________ Bonnie Mayer time: ____________ Date: ____________ Movements: ____________ Start time: ____________ Bonnie Mayer time: ____________ Date: ____________ Movements: ____________ Start time: ____________ Bonnie Mayer time: ____________ Date: ____________ Movements: ____________ Start time: ____________ Bonnie Mayer time: ____________  Date: ____________ Movements: ____________ Start time: ____________ Bonnie Mayer time: ____________ Date: ____________ Movements: ____________ Start time: ____________ Bonnie Mayer time: ____________ Date: ____________ Movements: ____________ Start time: ____________ Bonnie Mayer time: ____________ Date: ____________ Movements: ____________ Start time: ____________ Bonnie Mayer time: ____________ Date: ____________ Movements: ____________ Start time: ____________ Bonnie Mayer time: ____________ Date: ____________ Movements: ____________ Start time: ____________ Bonnie Mayer time: ____________ Document Released: 05/22/2006 Document Revised: 09/06/2013 Document Reviewed: 02/17/2012 Exit[employer] Patient Information 2015 [place]. This information is not intended to replace advice given to you by your health care provider. Make sure you discuss any questions you have with your health care provider.

## 2014-08-09 NOTE — Progress Notes (Signed)
Pt denies any problems or concerns at this time.  

## 2014-08-09 NOTE — Progress Notes (Signed)
G1P0 2576w1d Estimated Date of Delivery: 08/29/14  Blood pressure 100/60, pulse 84, weight 229 lb (103.874 kg), last menstrual period 11/22/2013.   refer to the ob flow sheet for FH and FHR, also BP, Wt, Urine results:notable for negative protein  Patient reports   good fetal movement, denies any bleeding and no rupture of membranes symptoms or regular contractions. Patient complaints:none.  Questions were answered. [facility]  Plan:  Continued routine obstetrical care, f/u GBS  F/u in 1 weeks for lrob visit

## 2014-08-10 LAB — GC/CHLAMYDIA PROBE AMP
Chlamydia trachomatis, NAA: NEGATIVE
Neisseria gonorrhoeae by PCR: NEGATIVE

## 2014-08-11 LAB — STREP GP B NAA: STREP GROUP B AG: NEGATIVE

## 2014-08-17 ENCOUNTER — Encounter: Payer: Self-pay | Admitting: Obstetrics and Gynecology

## 2014-08-17 ENCOUNTER — Ambulatory Visit (INDEPENDENT_AMBULATORY_CARE_PROVIDER_SITE_OTHER): Payer: Medicaid Other | Admitting: Obstetrics and Gynecology

## 2014-08-17 VITALS — BP 120/76 | HR 88 | Wt 230.0 lb

## 2014-08-17 DIAGNOSIS — Z3403 Encounter for supervision of normal first pregnancy, third trimester: Secondary | ICD-10-CM

## 2014-08-17 DIAGNOSIS — Z1389 Encounter for screening for other disorder: Secondary | ICD-10-CM

## 2014-08-17 DIAGNOSIS — Z331 Pregnant state, incidental: Secondary | ICD-10-CM

## 2014-08-17 DIAGNOSIS — Z3493 Encounter for supervision of normal pregnancy, unspecified, third trimester: Secondary | ICD-10-CM

## 2014-08-17 LAB — POCT URINALYSIS DIPSTICK
Blood, UA: NEGATIVE
Glucose, UA: NEGATIVE
KETONES UA: NEGATIVE
LEUKOCYTES UA: NEGATIVE
Nitrite, UA: NEGATIVE

## 2014-08-17 NOTE — Progress Notes (Signed)
Pt denies any problems or concerns at this time.  

## 2014-08-17 NOTE — Progress Notes (Signed)
Patient ID: Bonnie Mayer, female   DOB: December 27, 1990, 24 y.o.   MRN: [id number]  This chart was SCRIBED for Christin BachJohn Raiquan Chandler, MD by Ronney LionSuzanne Le, ED Scribe. This patient was seen in room 1 and the patient's care was started at 2:28 PM.   G1P0 7767w2d Estimated Date of Delivery: 08/29/14  LROB visit Blood pressure 120/76, pulse 88, weight 230 lb (104.327 kg), last menstrual period 11/22/2013.   refer to the ob flow sheet for FH and FHR, also BP, Wt, Urine results:notable for negative  Patient reports  + good fetal movement, denies any bleeding and no rupture of membranes symptoms or regular contractions. Patient complaints:Patient has no complaints at this time. She states that she has a family history of babies weighing about 9-10 lbs at birth.  FH: 40 cm efw 8+ lb FHR: 137 bpm  Questions were answered. Assessment:  Plan:  Continued routine obstetrical care, offer check cervix next week  F/u in 1 weeks for routine obstetrical care, call coverage addressed.  I personally performed the services described in this documentation, which was SCRIBED in my presence. The recorded information has been reviewed and considered accurate. It has been edited as necessary during review. Tilda BurrowFERGUSON,Jamarquis Crull V, MD

## 2014-08-18 ENCOUNTER — Inpatient Hospital Stay (HOSPITAL_COMMUNITY): Payer: Medicaid Other | Admitting: Anesthesiology

## 2014-08-18 ENCOUNTER — Encounter (HOSPITAL_COMMUNITY): Payer: Self-pay | Admitting: *Deleted

## 2014-08-18 ENCOUNTER — Inpatient Hospital Stay (HOSPITAL_COMMUNITY)
Admission: AD | Admit: 2014-08-18 | Discharge: 2014-08-20 | DRG: 774 | Disposition: A | Discharge status: Home / Self Care | Payer: Medicaid Other | Source: Ambulatory Visit | Attending: Obstetrics & Gynecology | Admitting: Obstetrics & Gynecology

## 2014-08-18 DIAGNOSIS — Z3A38 38 weeks gestation of pregnancy: Secondary | ICD-10-CM | POA: Diagnosis present

## 2014-08-18 DIAGNOSIS — O4292 Full-term premature rupture of membranes, unspecified as to length of time between rupture and onset of labor: Principal | ICD-10-CM | POA: Diagnosis present

## 2014-08-18 DIAGNOSIS — O99214 Obesity complicating childbirth: Secondary | ICD-10-CM | POA: Diagnosis present

## 2014-08-18 DIAGNOSIS — O09899 Supervision of other high risk pregnancies, unspecified trimester: Secondary | ICD-10-CM

## 2014-08-18 DIAGNOSIS — Z820 Family history of epilepsy and other diseases of the nervous system: Secondary | ICD-10-CM

## 2014-08-18 DIAGNOSIS — O133 Gestational [pregnancy-induced] hypertension without significant proteinuria, third trimester: Secondary | ICD-10-CM | POA: Diagnosis present

## 2014-08-18 DIAGNOSIS — Z8249 Family history of ischemic heart disease and other diseases of the circulatory system: Secondary | ICD-10-CM

## 2014-08-18 DIAGNOSIS — Z6837 Body mass index (BMI) 37.0-37.9, adult: Secondary | ICD-10-CM | POA: Diagnosis not present

## 2014-08-18 DIAGNOSIS — Z833 Family history of diabetes mellitus: Secondary | ICD-10-CM

## 2014-08-18 DIAGNOSIS — Z283 Underimmunization status: Secondary | ICD-10-CM

## 2014-08-18 DIAGNOSIS — O9989 Other specified diseases and conditions complicating pregnancy, childbirth and the puerperium: Secondary | ICD-10-CM

## 2014-08-18 DIAGNOSIS — O4290 Premature rupture of membranes, unspecified as to length of time between rupture and onset of labor, unspecified weeks of gestation: Secondary | ICD-10-CM | POA: Diagnosis present

## 2014-08-18 LAB — TYPE AND SCREEN
ABO/RH(D): O POS
ANTIBODY SCREEN: NEGATIVE

## 2014-08-18 LAB — CBC
HCT: 36 % (ref 36.0–46.0)
HEMOGLOBIN: 12.2 g/dL (ref 12.0–15.0)
MCH: 30.3 pg (ref 26.0–34.0)
MCHC: 33.9 g/dL (ref 30.0–36.0)
MCV: 89.3 fL (ref 78.0–100.0)
Platelets: 207 10*3/uL (ref 150–400)
RBC: 4.03 MIL/uL (ref 3.87–5.11)
RDW: 13.6 % (ref 11.5–15.5)
WBC: 8.6 10*3/uL (ref 4.0–10.5)

## 2014-08-18 LAB — POCT FERN TEST: POCT Fern Test: POSITIVE

## 2014-08-18 LAB — RPR: RPR Ser Ql: NONREACTIVE

## 2014-08-18 LAB — ABO/RH: ABO/RH(D): O POS

## 2014-08-18 MED ORDER — OXYTOCIN 40 UNITS IN LACTATED RINGERS INFUSION - SIMPLE MED
62.5000 mL/h | INTRAVENOUS | Status: DC
  Filled 2014-08-18: qty 1000

## 2014-08-18 MED ORDER — FENTANYL CITRATE (PF) 100 MCG/2ML IJ SOLN
100.0000 ug | INTRAMUSCULAR | Status: DC | PRN
  Administered 2014-08-18: 100 ug via INTRAVENOUS

## 2014-08-18 MED ORDER — OXYCODONE-ACETAMINOPHEN 5-325 MG PO TABS: 2.0000 | ORAL_TABLET | ORAL | Status: DC | PRN

## 2014-08-18 MED ORDER — EPHEDRINE 5 MG/ML INJ
10.0000 mg | INTRAVENOUS | Status: DC | PRN
  Filled 2014-08-18: qty 2

## 2014-08-18 MED ORDER — TERBUTALINE SULFATE 1 MG/ML IJ SOLN: 0.2500 mg | Freq: Once | INTRAMUSCULAR | Status: DC | PRN

## 2014-08-18 MED ORDER — TERBUTALINE SULFATE 1 MG/ML IJ SOLN: 0.2500 mg | Freq: Once | INTRAMUSCULAR | Status: AC | PRN

## 2014-08-18 MED ORDER — LIDOCAINE HCL (PF) 1 % IJ SOLN
INTRAMUSCULAR | Status: DC | PRN
  Administered 2014-08-18: 8 mL

## 2014-08-18 MED ORDER — PHENYLEPHRINE 40 MCG/ML (10ML) SYRINGE FOR IV PUSH (FOR BLOOD PRESSURE SUPPORT)
80.0000 ug | PREFILLED_SYRINGE | INTRAVENOUS | Status: DC | PRN
  Filled 2014-08-18: qty 2
  Filled 2014-08-18: qty 20

## 2014-08-18 MED ORDER — LACTATED RINGERS IV SOLN
500.0000 mL | Freq: Once | INTRAVENOUS | Status: AC
  Administered 2014-08-18: 500 mL via INTRAVENOUS

## 2014-08-18 MED ORDER — LACTATED RINGERS IV SOLN: 500.0000 mL | INTRAVENOUS | Status: DC | PRN

## 2014-08-18 MED ORDER — CITRIC ACID-SODIUM CITRATE 334-500 MG/5ML PO SOLN: 30.0000 mL | ORAL | Status: DC | PRN

## 2014-08-18 MED ORDER — PHENYLEPHRINE 40 MCG/ML (10ML) SYRINGE FOR IV PUSH (FOR BLOOD PRESSURE SUPPORT)
80.0000 ug | PREFILLED_SYRINGE | INTRAVENOUS | Status: DC | PRN
  Filled 2014-08-18: qty 2

## 2014-08-18 MED ORDER — MISOPROSTOL 50MCG HALF TABLET
50.0000 ug | ORAL_TABLET | ORAL | Status: DC
  Administered 2014-08-18: 50 ug via ORAL
  Filled 2014-08-18 (×8): qty 1

## 2014-08-18 MED ORDER — ACETAMINOPHEN 325 MG PO TABS: 650.0000 mg | ORAL_TABLET | ORAL | Status: DC | PRN

## 2014-08-18 MED ORDER — OXYTOCIN 40 UNITS IN LACTATED RINGERS INFUSION - SIMPLE MED
1.0000 m[IU]/min | INTRAVENOUS | Status: DC
Start: 2014-08-18 — End: 2014-08-19
  Administered 2014-08-18: 2 m[IU]/min via INTRAVENOUS

## 2014-08-18 MED ORDER — OXYTOCIN BOLUS FROM INFUSION
500.0000 mL | INTRAVENOUS | Status: DC
  Administered 2014-08-19: 500 mL via INTRAVENOUS

## 2014-08-18 MED ORDER — DIPHENHYDRAMINE HCL 50 MG/ML IJ SOLN: 12.5000 mg | INTRAMUSCULAR | Status: DC | PRN

## 2014-08-18 MED ORDER — FENTANYL 2.5 MCG/ML BUPIVACAINE 1/10 % EPIDURAL INFUSION (WH - ANES)
14.0000 mL/h | INTRAMUSCULAR | Status: DC | PRN
  Administered 2014-08-18 (×2): 14 mL/h via EPIDURAL
  Filled 2014-08-18: qty 125

## 2014-08-18 MED ORDER — ZOLPIDEM TARTRATE 5 MG PO TABS
5.0000 mg | ORAL_TABLET | Freq: Every evening | ORAL | Status: DC | PRN
Start: 2014-08-18 — End: 2014-08-19

## 2014-08-18 MED ORDER — OXYCODONE-ACETAMINOPHEN 5-325 MG PO TABS: 1.0000 | ORAL_TABLET | ORAL | Status: DC | PRN

## 2014-08-18 MED ORDER — LIDOCAINE HCL (PF) 1 % IJ SOLN
30.0000 mL | INTRAMUSCULAR | Status: DC | PRN
  Filled 2014-08-18: qty 30

## 2014-08-18 MED ORDER — FENTANYL CITRATE 0.05 MG/ML IJ SOLN
100.0000 ug | INTRAMUSCULAR | Status: DC | PRN
  Administered 2014-08-18: 100 ug via INTRAVENOUS
  Filled 2014-08-18 (×2): qty 2

## 2014-08-18 MED ORDER — LACTATED RINGERS IV SOLN
INTRAVENOUS | Status: DC
  Administered 2014-08-18 (×2): via INTRAVENOUS

## 2014-08-18 MED ORDER — ONDANSETRON HCL 4 MG/2ML IJ SOLN
4.0000 mg | Freq: Four times a day (QID) | INTRAMUSCULAR | Status: DC | PRN
  Administered 2014-08-18: 4 mg via INTRAVENOUS
  Filled 2014-08-18: qty 2

## 2014-08-18 NOTE — Anesthesia Procedure Notes (Signed)
Epidural Patient location during procedure: OB Start time: 08/18/2014 5:36 PM End time: 08/18/2014 5:41 PM  Staffing Anesthesiologist: Leilani AbleHATCHETT, Niaja Stickley Performed by: anesthesiologist   Preanesthetic Checklist Completed: patient identified, surgical consent, pre-op evaluation, timeout performed, IV checked, risks and benefits discussed and monitors and equipment checked  Epidural Patient position: sitting Prep: site prepped and draped and DuraPrep Patient monitoring: continuous pulse ox and blood pressure Approach: midline Location: L3-L4 Injection technique: LOR air  Needle:  Needle type: Tuohy  Needle gauge: 17 G Needle length: 9 cm and 9 Needle insertion depth: 7 cm Catheter type: closed end flexible Catheter size: 19 Gauge Catheter at skin depth: 12 cm Test dose: negative and Other  Assessment Sensory level: T9 Events: blood not aspirated, injection not painful, no injection resistance, negative IV test and no paresthesia  Additional Notes Reason for block:procedure for pain

## 2014-08-18 NOTE — Progress Notes (Signed)
VE deferred per Ivonne AndrewV. Smith, CNM.  CNM states to continue with POC to give Cytotec orally.

## 2014-08-18 NOTE — Progress Notes (Signed)
Anesthesia notified regarding pt request for epidural

## 2014-08-18 NOTE — Progress Notes (Signed)
Unable to trace ctxs despite repositioning Toco several times and palpation.  Pt reports ctxs approximately every 3 minutes

## 2014-08-18 NOTE — Progress Notes (Signed)
   Charlsie Merleslondra Stoy is a 24 y.o. G1P0 at 436w3d  admitted for PROM  Subjective:  No c/o pressure. Comfortable Objective: Filed Vitals:   08/18/14 2000 08/18/14 2030 08/18/14 2100 08/18/14 2130  BP: 123/79 122/69 127/81 127/90  Pulse: 99 101 104 97  Temp: 98 F (36.7 C)     TempSrc: Oral     Resp: 18 18 18 18   Height:      Weight:      SpO2:          FHT:  FHR: 155 bpm, variability: moderate,  accelerations:  Present,  decelerations:  Absent UC:   180 MVU SVE:   Dilation: Lip/rim Effacement (%): 100 Station: +1 Exam by:: Veronica Mensah Pitocin @ 16 mu/min  Labs: Lab Results  Component Value Date   WBC 8.6 08/18/2014   HGB 12.2 08/18/2014   HCT 36.0 08/18/2014   MCV 89.3 08/18/2014   PLT 207 08/18/2014    Assessment / Plan: Induction of labor due to PROM,  progressing well on pitocin  Labor: Progressing normally Fetal Wellbeing:  Category I Pain Control:  Epidural Anticipated MOD:  NSVD  CRESENZO-DISHMAN,Bonny Vanleeuwen 08/18/2014, 10:00 PM

## 2014-08-18 NOTE — MAU Note (Signed)
Pt presents with ROM at 0130, denies pain

## 2014-08-18 NOTE — H&P (Signed)
LABOR ADMISSION HISTORY AND PHYSICAL  Bonnie Mayer is a 24 y.o. female G1P0 with IUP at 5150w3d by LMP presenting for SROM. She noticed around 0130 this morning a large wet spot and thought she had wet herself. The fluid was clear and watery. She denies any ctx or pain. She reports that she has been feeling well lately and has had no issues with the pregnancy. She reports +FMs, no VB, no blurry vision, headaches or peripheral edema, or RUQ pain.  She plans on breast and bottle feeding. She is undecided on birth control.  Dating: By LMP --->  Estimated Date of Delivery: 08/29/14  Sono:    @[redacted]w[redacted]d , CWD, normal anatomy, cephalic presentation, 1239g, 84%60% EFW   Prenatal History/Complications:  Prenatal Transfer Tool  Maternal Diabetes: No Genetic Screening: Declined Maternal Ultrasounds/Referrals: Normal Fetal Ultrasounds or other Referrals:  None Maternal Substance Abuse:  No Significant Maternal Medications:  None Significant Maternal Lab Results: None   [facility] Prenatal Labs  Dating LMP Blood type: O/POS/-- (10/19 1506)   Genetic Screen Declined Antibody:Negative (02/01 0925)  Anatomic US Normal Rubella: 0.73 (10/19 1506)  GTT 2 hr 80/148/91 RPR: Non Reactive (02/01 0925)   Flu vaccine 01/2014 HBsAg: NEGATIVE (10/19 1506)   TDaP vaccine                                               Rhogam: HIV: NONREACTIVE (10/19 1506)   GBS  Neg                                      (For PCN allergy, check sensitivities) GBS: Neg  Contraception Undecided Pap:  Baby Food Breast/Bottle   Circumcision Declines   Pediatrician [place]   Support Person FOB      Past Medical History: Past Medical History  Diagnosis Date  . Medical history non-contributory     Past Surgical History: Past Surgical History  Procedure Laterality Date  . No past surgeries      Obstetrical History: OB History    Gravida Para Term Preterm AB TAB SAB Ectopic Multiple Living   1                Social History: History   Social History  . Marital Status: Unknown    Spouse Name: N/A  . Number of Children: N/A  . Years of Education: N/A   Social History Main Topics  . Smoking status: Never Smoker   . Smokeless tobacco: Never Used  . Alcohol Use: No  . Drug Use: No  . Sexual Activity: Yes   Other Topics Concern  . None   Social History Narrative    Family History: Family History  Problem Relation Age of Onset  . Diabetes Mother   . Hyperlipidemia Mother   . Hypertension Mother   . Cancer Paternal Uncle   . Alzheimer's disease Maternal Grandmother   . Diabetes Maternal Grandmother   . Hyperlipidemia Maternal Grandmother   . Hypertension Maternal Grandmother   . Diabetes Maternal Grandfather   . Hyperlipidemia Maternal Grandfather   . Hypertension Maternal Grandfather   . Cancer Paternal Grandmother     breast    Allergies: No Known Allergies  Prescriptions prior to admission  Medication Sig Dispense Refill Last Dose  .  Prenatal Vit-Fe Fumarate-FA (MULTIVITAMIN-PRENATAL) 27-0.8 MG TABS tablet Take 1 tablet by mouth daily at 12 noon. 30 each 11 08/17/2014 at Unknown time     Review of Systems   All systems reviewed and negative except as stated in HPI  Blood pressure 133/71, pulse 84, temperature 98 F (36.7 C), temperature source Oral, resp. rate 20, height  (1.676 m), weight 106.142 kg (234 lb), last menstrual period 11/22/2013, SpO2 100 %. General appearance: alert, cooperative and no distress Lungs: clear to auscultation bilaterally Heart: regular rate and rhythm Abdomen: soft, non-tender Extremities: no sign of DVT Presentation: cephalic Fetal monitoringBaseline: 140 bpm Uterine activity: none noted      Results for orders placed or performed during the hospital encounter of 08/18/14 (from the past 24 hour(s))  Fern Test   Collection Time: 08/18/14  3:38 AM  Result Value Ref Range   POCT Fern Test Positive = ruptured amniotic  [place]   Results for orders placed or performed in visit on 08/17/14 (from the past 24 hour(s))  POCT urinalysis dipstick   Collection Time: 08/17/14  2:22 PM  Result Value Ref Range   Color, UA     Clarity, UA     Glucose, UA neg    Bilirubin, UA     Ketones, UA neg    Spec Grav, UA     Blood, UA neg    pH, UA     Protein, UA trace    Urobilinogen, UA     Nitrite, UA neg    Leukocytes, UA Negative     Patient Active Problem List   Diagnosis Date Noted  . Echogenic intracardiac focus of fetus on prenatal ultrasound 06/08/2014  . Rubella non-immune status, antepartum 03/01/2014  . Supervision of normal first pregnancy 02/01/2014    Assessment: Bonnie Mayer is a 24 y.o. G1P0 at [redacted]w[redacted]d here for SROM.   #Labor:Expectant management #Pain: None reported #ID:  Rubella nonimmune #MOF: Breast/Bottle #MOC:Undecided #Circ:  Declined  Therese Sarah, Medical Student 08/18/2014, 4:21 AM

## 2014-08-18 NOTE — Progress Notes (Signed)
Bonnie Mayer is a 24 y.o. G1P0 at 37[name]37w3d by LMP admitted for SROM  Subjective: Patient reports that she is feeling well.  Denies excessive pain or pressure.  She reports not needing any pain medication/epidural at this time.  Objective: BP 131/82 mmHg  Pulse 92  Temp(Src) 98.2 F (36.8 C) (Oral)  Resp 18  Ht 5\' 6"  (1.676 m)  Wt 234 lb (106.142 kg)  BMI 37.79 kg/m2  SpO2 100%  LMP 11/22/2013     FHT:  FHR: 145 bpm, variability: moderate,  accelerations:  Present,  decelerations:  Absent UC:   irregular, every 2-4 minutes SVE:   Dilation: 3 Effacement (%): 80 Station: -2 Exam by:: Doreatha MassedF. Morris, RNC  Labs: Lab Results  Component Value Date   WBC 8.6 08/18/2014   HGB 12.2 08/18/2014   HCT 36.0 08/18/2014   MCV 89.3 08/18/2014   PLT 207 08/18/2014    Assessment / Plan: Will start pitocin  Labor: start pitocin Fetal Wellbeing:  Category I Pain Control:  Labor support without medications I/D:  n/a Anticipated MOD:  NSVD  Raliegh IpGottschalk, Zainah Steven M, DO  08/18/2014, 12:47 PM

## 2014-08-18 NOTE — Progress Notes (Signed)
Ivonne AndrewV. Smith, CNM verified fetal presentation vertex via ultrasound

## 2014-08-18 NOTE — Progress Notes (Signed)
   Bonnie Mayer is a 24 y.o. G1P0 at 7671w3d  admitted for induction of labor due to Hypertension.  Subjective: Comfortable with epidural  Objective: Filed Vitals:   08/18/14 1814 08/18/14 1815 08/18/14 1845 08/18/14 1900  BP:  106/60 121/74 113/76  Pulse: 96 90 84 90  Temp:      TempSrc:      Resp: 18  20 18   Height:      Weight:      SpO2: 98%         FHT:  FHR: 140 bpm, variability: moderate,  accelerations:  Present,  decelerations:  Absent UC:   MVU ~ 160 SVE:   Dilation: 6 Effacement (%): 90 Station: -1 Exam by:: F. Cresenzo-Dishman, CNM Pitocin @ 12 mu/min  Labs: Lab Results  Component Value Date   WBC 8.6 08/18/2014   HGB 12.2 08/18/2014   HCT 36.0 08/18/2014   MCV 89.3 08/18/2014   PLT 207 08/18/2014    Assessment / Plan: Induction of labor due to PROM,  progressing well on pitocin May increase pitocin to achieve a more adequate labor pattern Labor: Progressing normally Fetal Wellbeing:  Category I Pain Control:  Epidural Anticipated MOD:  NSVD  CRESENZO-DISHMAN,Huberta Tompkins 08/18/2014, 7:20 PM

## 2014-08-18 NOTE — Anesthesia Preprocedure Evaluation (Signed)
Anesthesia Evaluation  Patient identified by MRN, date of birth, ID band Patient awake    Reviewed: Allergy & Precautions, H&P , NPO status , Patient's Chart, lab work & pertinent test results  Airway Mallampati: II  TM Distance: >3 FB Neck ROM: full    Dental no notable dental hx.    Pulmonary neg pulmonary ROS,    Pulmonary exam normal       Cardiovascular negative cardio ROS      Neuro/Psych negative neurological ROS  negative psych ROS   GI/Hepatic negative GI ROS, Neg liver ROS,   Endo/Other  Morbid obesity  Renal/GU negative Renal ROS     Musculoskeletal   Abdominal (+) + obese,   Peds  Hematology negative hematology ROS (+)   Anesthesia Other Findings   Reproductive/Obstetrics (+) Pregnancy                             Anesthesia Physical Anesthesia Plan  ASA: III  Anesthesia Plan: Epidural   Post-op Pain Management:    Induction:   Airway Management Planned:   Additional Equipment:   Intra-op Plan:   Post-operative Plan:   Informed Consent: I have reviewed the patients History and Physical, chart, labs and discussed the procedure including the risks, benefits and alternatives for the proposed anesthesia with the patient or authorized representative who has indicated his/her understanding and acceptance.     Plan Discussed with:   Anesthesia Plan Comments:         Anesthesia Quick Evaluation  

## 2014-08-19 ENCOUNTER — Encounter (HOSPITAL_COMMUNITY): Payer: Self-pay | Admitting: General Practice

## 2014-08-19 DIAGNOSIS — O133 Gestational [pregnancy-induced] hypertension without significant proteinuria, third trimester: Secondary | ICD-10-CM

## 2014-08-19 DIAGNOSIS — O4292 Full-term premature rupture of membranes, unspecified as to length of time between rupture and onset of labor: Secondary | ICD-10-CM

## 2014-08-19 LAB — CBC
HCT: 34.1 % — ABNORMAL LOW (ref 36.0–46.0)
Hemoglobin: 11.3 g/dL — ABNORMAL LOW (ref 12.0–15.0)
MCH: 29.8 pg (ref 26.0–34.0)
MCHC: 33.1 g/dL (ref 30.0–36.0)
MCV: 90 fL (ref 78.0–100.0)
PLATELETS: 173 10*3/uL (ref 150–400)
RBC: 3.79 MIL/uL — AB (ref 3.87–5.11)
RDW: 13.7 % (ref 11.5–15.5)
WBC: 17.4 10*3/uL — ABNORMAL HIGH (ref 4.0–10.5)

## 2014-08-19 MED ORDER — BISACODYL 10 MG RE SUPP: 10.0000 mg | Freq: Every day | RECTAL | Status: DC | PRN

## 2014-08-19 MED ORDER — SENNOSIDES-DOCUSATE SODIUM 8.6-50 MG PO TABS
2.0000 | ORAL_TABLET | ORAL | Status: DC
  Administered 2014-08-19: 2 via ORAL
  Filled 2014-08-19: qty 2

## 2014-08-19 MED ORDER — FERROUS SULFATE 325 (65 FE) MG PO TABS
325.0000 mg | ORAL_TABLET | Freq: Two times a day (BID) | ORAL | Status: DC
  Administered 2014-08-19 – 2014-08-20 (×3): 325 mg via ORAL
  Filled 2014-08-19 (×3): qty 1

## 2014-08-19 MED ORDER — PRENATAL MULTIVITAMIN CH
1.0000 | ORAL_TABLET | Freq: Every day | ORAL | Status: DC
  Administered 2014-08-19 – 2014-08-20 (×2): 1 via ORAL
  Filled 2014-08-19 (×2): qty 1

## 2014-08-19 MED ORDER — OXYCODONE-ACETAMINOPHEN 5-325 MG PO TABS: 2.0000 | ORAL_TABLET | ORAL | Status: DC | PRN

## 2014-08-19 MED ORDER — MEASLES, MUMPS & RUBELLA VAC ~~LOC~~ INJ
0.5000 mL | INJECTION | Freq: Once | SUBCUTANEOUS | Status: AC
  Administered 2014-08-20: 0.5 mL via SUBCUTANEOUS
  Filled 2014-08-19 (×2): qty 0.5

## 2014-08-19 MED ORDER — OXYTOCIN 40 UNITS IN LACTATED RINGERS INFUSION - SIMPLE MED: 62.5000 mL/h | INTRAVENOUS | Status: DC | PRN

## 2014-08-19 MED ORDER — OXYCODONE-ACETAMINOPHEN 5-325 MG PO TABS: 1.0000 | ORAL_TABLET | ORAL | Status: DC | PRN

## 2014-08-19 MED ORDER — WITCH HAZEL-GLYCERIN EX PADS
1.0000 | MEDICATED_PAD | CUTANEOUS | Status: DC | PRN
Start: 2014-08-19 — End: 2014-08-20

## 2014-08-19 MED ORDER — METHYLERGONOVINE MALEATE 0.2 MG/ML IJ SOLN: 0.2000 mg | INTRAMUSCULAR | Status: DC | PRN

## 2014-08-19 MED ORDER — FLEET ENEMA 7-19 GM/118ML RE ENEM: 1.0000 | ENEMA | Freq: Every day | RECTAL | Status: DC | PRN

## 2014-08-19 MED ORDER — MISOPROSTOL 200 MCG PO TABS
ORAL_TABLET | ORAL | Status: AC
  Filled 2014-08-19: qty 4

## 2014-08-19 MED ORDER — ONDANSETRON HCL 4 MG/2ML IJ SOLN: 4.0000 mg | INTRAMUSCULAR | Status: DC | PRN

## 2014-08-19 MED ORDER — ACETAMINOPHEN 325 MG PO TABS: 650.0000 mg | ORAL_TABLET | ORAL | Status: DC | PRN

## 2014-08-19 MED ORDER — LANOLIN HYDROUS EX OINT: TOPICAL_OINTMENT | CUTANEOUS | Status: DC | PRN

## 2014-08-19 MED ORDER — MISOPROSTOL 200 MCG PO TABS
800.0000 ug | ORAL_TABLET | Freq: Once | ORAL | Status: AC
  Administered 2014-08-19: 800 ug via RECTAL

## 2014-08-19 MED ORDER — ZOLPIDEM TARTRATE 5 MG PO TABS
5.0000 mg | ORAL_TABLET | Freq: Every evening | ORAL | Status: DC | PRN
Start: 2014-08-19 — End: 2014-08-20

## 2014-08-19 MED ORDER — TETANUS-DIPHTH-ACELL PERTUSSIS 5-2.5-18.5 LF-MCG/0.5 IM SUSP
0.5000 mL | Freq: Once | INTRAMUSCULAR | Status: AC
  Administered 2014-08-20: 0.5 mL via INTRAMUSCULAR

## 2014-08-19 MED ORDER — METHYLERGONOVINE MALEATE 0.2 MG PO TABS: 0.2000 mg | ORAL_TABLET | ORAL | Status: DC | PRN

## 2014-08-19 MED ORDER — ONDANSETRON HCL 4 MG PO TABS: 4.0000 mg | ORAL_TABLET | ORAL | Status: DC | PRN

## 2014-08-19 MED ORDER — METHYLERGONOVINE MALEATE 0.2 MG/ML IJ SOLN
0.2000 mg | Freq: Once | INTRAMUSCULAR | Status: AC
  Administered 2014-08-19: 0.2 mg via INTRAMUSCULAR

## 2014-08-19 MED ORDER — DIBUCAINE 1 % RE OINT: 1.0000 "application " | TOPICAL_OINTMENT | RECTAL | Status: DC | PRN

## 2014-08-19 MED ORDER — DIPHENHYDRAMINE HCL 25 MG PO CAPS: 25.0000 mg | ORAL_CAPSULE | Freq: Four times a day (QID) | ORAL | Status: DC | PRN

## 2014-08-19 MED ORDER — IBUPROFEN 600 MG PO TABS
600.0000 mg | ORAL_TABLET | Freq: Four times a day (QID) | ORAL | Status: DC
  Administered 2014-08-19 – 2014-08-20 (×6): 600 mg via ORAL
  Filled 2014-08-19 (×6): qty 1

## 2014-08-19 MED ORDER — BENZOCAINE-MENTHOL 20-0.5 % EX AERO
1.0000 "application " | INHALATION_SPRAY | CUTANEOUS | Status: DC | PRN
  Administered 2014-08-19: 1 via TOPICAL
  Filled 2014-08-19: qty 56

## 2014-08-19 MED ORDER — SIMETHICONE 80 MG PO CHEW: 80.0000 mg | CHEWABLE_TABLET | ORAL | Status: DC | PRN

## 2014-08-19 NOTE — Anesthesia Postprocedure Evaluation (Signed)
Anesthesia Post Note  Patient: Bonnie Mayer  Procedure(s) Performed: * No procedures listed *  Anesthesia type: Epidural  Patient location: Mother/Baby  Post pain: Pain level controlled  Post assessment: Post-op Vital signs reviewed  Last Vitals:  Filed Vitals:   08/19/14 0430  BP: 119/66  Pulse: 82  Temp: 37.2 C  Resp: 18    Post vital signs: Reviewed  Level of consciousness:alert  Complications: No apparent anesthesia complications

## 2014-08-19 NOTE — Lactation Note (Addendum)
This note was copied from the chart of Bonnie Mayer Salmela. Lactation Consultation Note: Lactation Brochure given with basic teaching done. Infant is 3913 hours old. He has had several attempts to breastfeed. Mother has semi-flat nipple tissue. Mother has a history of infertility. She has LGT with a space between her breast. Mother states she took fertility treatment for one year before becoming pregnant.  Mother has bilateral suck bruised on areola. Lots of teaching with parents. Father speaks little english and has limited understanding. Father declines need for interpreter. Mother states she will interpret all teaching to father. Father was given a [place] Baby and Me book . Mother taught hans expression . Observed several drops of colostrum. Father very happy to see colostrum. Mother was also given shells and a hand pump with a #27 flange. Pre pumped for a few mins and nipple firms. Infant latched on and off for 10 mins on each breast. Observed good depth. Mother denies pain with  latch. Advised mother to feed infant 8-12 times in 24 hours and with feeding cues. Mother was taught to use a nipple shield. Infant refused nipple shield. Mother receptive to all teaching.   Patient Name: Bonnie Mayer Navarra EAVWU'JToday's Date: 08/19/2014 Reason for consult: Initial assessment   Maternal Data Has patient been taught Hand Expression?: Yes Does the patient have breastfeeding experience prior to this delivery?: No  Feeding Feeding Type: Breast Fed Length of feed: 10 min (infant on and off for 10)  LATCH Score/Interventions Latch: Grasps breast easily, tongue down, lips flanged, rhythmical sucking. Intervention(s): Skin to skin;Teach feeding cues;Waking techniques Intervention(s): Adjust position;Assist with latch;Breast massage;Breast compression  Audible Swallowing: None (no sustained latch) Intervention(s): Skin to skin Intervention(s): Alternate breast massage  Type of Nipple: Flat  Comfort  (Breast/Nipple): Filling, red/small blisters or bruises, mild/mod discomfort  Problem noted: Cracked, bleeding, blisters, bruises;Mild/Moderate discomfort Interventions  (Cracked/bleeding/bruising/blister): Hand pump Interventions (Mild/moderate discomfort): Hand expression;Pre-pump if needed  Hold (Positioning): No assistance needed to correctly position infant at breast. Intervention(s): Breastfeeding basics reviewed;Support Pillows;Position options;Skin to skin  LATCH Score: 6  Lactation Tools Discussed/Used     Consult Status Consult Status: Follow-up Date: 08/19/14 Follow-up type: In-patient    Stevan BornKendrick, Lacole Komorowski [facility] 08/19/2014, 2:56 PM

## 2014-08-19 NOTE — Progress Notes (Signed)
UR chart review completed.  

## 2014-08-19 NOTE — Plan of Care (Signed)
Problem: Discharge Progression Outcomes Goal: MMR given as ordered Outcome: Not Met (add Reason) Needs MMR

## 2014-08-19 NOTE — Lactation Note (Signed)
This note was copied from the chart of Bonnie Mayer. Lactation Consultation Note  Mother's nipples are bruised and sore and is requesting formula. Assessed baby's mouth and noted baby biting, very few sucks elicited. Baby seems to have a tight anterior lingual frenulum contributing to limited tongue movement. Suggest parents discuss this with Pediatrician. Suggest that if mother is too sore to breastfeed she can pump with DEBP. Mother has tried pumping with manual pump but states she is too sore. Discussed that with DEBP she can adjust suction and may feel more comfortable. Reviewed supply and demand and the importance of protecting her milk supply. Mother will let Morrie SheldonAshley RN decide. Mother has comfort gels and knows to apply ebm.  Patient Name: Bonnie Bonnie Mayer ZOXWR'UToday's Date: 08/19/2014     Maternal Data    Feeding    LATCH Score/Interventions                      Lactation Tools Discussed/Used     Consult Status      Bonnie Mayer, Bonnie [facility] 08/19/2014, 11:52 PM

## 2014-08-20 MED ORDER — IBUPROFEN 600 MG PO TABS: 600.0000 mg | ORAL_TABLET | Freq: Four times a day (QID) | ORAL | Status: AC

## 2014-08-20 NOTE — Discharge Instructions (Signed)

## 2014-08-20 NOTE — Lactation Note (Signed)
This note was copied from the chart of Bonnie Mayer. Lactation Consultation Note Mom given shells earlier to wear in bra to assist in nipples everting more. Had fit nipples w/#16NS prior to wearing shells. Mom removed shells and nipples larger now #20 NS fits well. Assisted in latch. W/curve tip syring injected formula into NS to get baby started in suckling on NS. Baby suckled for 10 min. Then became very sleepy. Mom is going to supplement after BF baby until she is able to see colostrum when hand expressing. I hand expressed for several minutes and nothing noted. Massaged breast and hand expressed again and no colostrum noted. Encouraged lots of STS. Baby is having f/u at dr. Isidore Moosffice and will f/u w/LC out pt. For using NS. Instructed mom if she keeps wearing NS and nipple stays everted she can wean off using NS and latch baby onto breast. Encouraged mom to call for LC f/u for next week. Mom has such tender breast, baby had wide flange at breast and was tender, but tolerable.  Encouraged mom to post-pump breast to stimulate milk to come in. Mom isn't very compliant at this. Has hand pump to take home.  Is planning on breast bottle feeding baby. Patient Name: Bonnie Mayer EAVWU'JToday's Date: 08/20/2014 Reason for consult: Follow-up assessment;Difficult latch;Breast/nipple pain   Maternal Data    Feeding Feeding Type: Breast Fed  LATCH Score/Interventions Latch: Repeated attempts needed to sustain latch, nipple held in mouth throughout feeding, stimulation needed to elicit sucking reflex. Intervention(s): Teach feeding cues;Waking techniques Intervention(s): Adjust position;Breast massage;Assist with latch;Breast compression  Audible Swallowing: A few with stimulation Intervention(s): Hand expression  Type of Nipple: Flat Intervention(s): Shells;Double electric pump  Comfort (Breast/Nipple): Filling, red/small blisters or bruises, mild/mod discomfort  Problem noted: Mild/Moderate  discomfort Interventions  (Cracked/bleeding/bruising/blister): Expressed breast milk to nipple;Reverse pressure;Double electric pump Interventions (Mild/moderate discomfort): Hand massage;Hand expression;Comfort gels;Breast shields;Post-pump Interventions (Severe discomfort): Double electric pum  Hold (Positioning): Assistance needed to correctly position infant at breast and maintain latch. Intervention(s): Breastfeeding basics reviewed;Support Pillows;Position options;Skin to skin  LATCH Score: 5  Lactation Tools Discussed/Used Tools: Shells;Nipple Shields;Pump;Comfort gels Nipple shield size: 20 Shell Type: Inverted   Consult Status Consult Status: Complete Follow-up type: Out-patient    Charyl DancerCARVER, Diksha Tagliaferro G 08/20/2014, 2:47 PM

## 2014-08-20 NOTE — Lactation Note (Signed)
This note was copied from the chart of Bonnie Mayer Kopko. Lactation Consultation Note Mom has been bottle feeding baby d/t breast so tender she couldn't stand for baby to latch. Mom has #20 NS, noted flat nipples, Lt. Nipple w/a few raw areas. Noted red/purple "hickey" bruising areolas. Some edema to areolas. Has general edema to body. Moms tubular breast with minimal breast tissue in center of breast leaving a wide space between breast. Most of breast tissue are to outer sides of breast.  Breast feel heavy, hand expression attempted, very tender, no colostrum noted. Mom denies seeing any colostrum. Denies breast changes during pregnancy in size or tenderness other than areola color change.  Fitted mom w/#16NS. It was tender to touch areola and nipples at all. Mom has been supplementing w/formula. Wanting an earlier d/c home. I recommend if mom plans on BF she stays another night since BF isn't going well. But if she plans on bottle feeding it is ok to go home. Needs a lot more help BF if pt. Can tolerate it. Has DEBP in rm. But hasn't used it but once. Patient Name: Bonnie Mayer Kim ZOXWR'UToday's Date: 08/20/2014 Reason for consult: Follow-up assessment;Breast/nipple pain;Difficult latch   Maternal Data    Feeding Feeding Type: Formula Nipple Type: Slow - flow  LATCH Score/Interventions       Type of Nipple: Flat Intervention(s): Shells;Double electric pump  Comfort (Breast/Nipple): Filling, red/small blisters or bruises, mild/mod discomfort  Problem noted: Severe discomfort Interventions  (Cracked/bleeding/bruising/blister): Double electric pump Interventions (Mild/moderate discomfort): Comfort gels;Breast shields;Hand massage;Hand expression Interventions (Severe discomfort): Double electric pum        Lactation Tools Discussed/Used Tools: Shells;Nipple Shields;Pump;Comfort gels;Bottle Nipple shield size: 16 Shell Type: Inverted Breast pump type: Double-Electric Breast  Pump   Consult Status Consult Status: Follow-up Date: 08/20/14 Follow-up type: In-patient    Charyl DancerCARVER, Royelle Hinchman G 08/20/2014, 9:16 AM

## 2014-08-20 NOTE — Discharge Summary (Signed)
Obstetric Discharge Summary Reason for Admission: rupture of membranes Prenatal Procedures: none Intrapartum Procedures: spontaneous vaginal delivery Postpartum Procedures: none Complications-Operative and Postpartum: 1st degree perineal laceration HEMOGLOBIN  Date Value Ref Range Status  08/19/2014 11.3* 12.0 - 15.0 g/dL Final   HCT  Date Value Ref Range Status  08/19/2014 34.1* 36.0 - 46.0 % Final   Bonnie Mayer is a 23yo G1 admitted @ 38.3wks with SROM and no labor. She was given cytotec for ripening followed by Pitocin and she progressed to SVD early in the AM of 4/15. She had a mild PPH post delivery controlled with cytotec and methergine. By PPD#1 she was doing well and requesting early d/c. She is breast and bottlefeeding and is thinking about condoms for contraception (counseling on options).  Physical Exam:  General: alert, cooperative and no distress  Heart: RRR Lungs: nl effort Lochia: appropriate Uterine Fundus: firm DVT Evaluation: No evidence of DVT seen on physical exam.  Discharge Diagnoses: Term Pregnancy-delivered  Discharge Information: Date: 08/20/2014 Activity: pelvic rest Diet: routine Medications: PNV and Ibuprofen Condition: stable Instructions: refer to practice specific booklet Discharge to: home Follow-up Information    Follow up with [facility]. Schedule an appointment as soon as possible for a visit in 4 weeks.   Why:  For your postpartum appointment.   Contact information:   [address]  [place] [id number] [phone]      Newborn Data: Live born female  Birth Weight: 8 lb 9.4 oz (3895 g) APGAR: 8, 9  Home with mother.  Cam HaiSHAW, Zameria Vogl CNM 08/20/2014, 7:52 AM

## 2014-08-24 ENCOUNTER — Encounter: Payer: Medicaid Other | Admitting: Obstetrics and Gynecology

## 2014-09-20 ENCOUNTER — Encounter: Payer: Self-pay | Admitting: Women's Health

## 2014-09-20 ENCOUNTER — Ambulatory Visit (INDEPENDENT_AMBULATORY_CARE_PROVIDER_SITE_OTHER): Payer: Medicaid Other | Admitting: Women's Health

## 2014-09-20 NOTE — Progress Notes (Signed)
Patient ID: Bonnie Mayer Strickling, female   DOB: 23-May-1990, 24 y.o.   MRN: [id number] Subjective:    Bonnie Mayer Eschbach is a 24 y.o. [id number] Hispanic female who presents for a postpartum visit. She is 4 weeks postpartum following a spontaneous vaginal delivery at 38.4 gestational weeks. Anesthesia: epidural. I have fully reviewed the prenatal and intrapartum course. Postpartum course has been uncomplicated. Baby's course has been uncomplicated. Baby is feeding by bottle. Bleeding no bleeding. Bowel function is normal. Bladder function is normal. Patient is not sexually active. Last sexual activity: prior to birth of baby. Contraception method is plans to use condoms. Postpartum depression screening: negative. Score 2.  [facility] and [facility].  The following portions of the patient's history were reviewed and updated as appropriate: allergies, current medications, past medical history, past surgical history and problem list.  Review of Systems Pertinent items are noted in HPI.   Filed Vitals:   09/20/14 1107  BP: 110/78  Pulse: 64  Weight: 207 lb (93.895 kg)   No LMP recorded.  Objective:   General:  alert, cooperative and no distress   Breasts:  deferred, no complaints  Lungs: clear to auscultation bilaterally  Heart:  regular rate and rhythm  Abdomen: soft, nontender   Vulva: normal  Vagina: normal vagina  Cervix:  closed  Corpus: Well-involuted  Adnexa:  Non-palpable  Rectal Exam: No hemorrhoids        Assessment:   Postpartum exam 4 wks s/p SVB Bottlefeeding Depression screening Contraception counseling   Plan:   Contraception: condoms Follow up in: 1 year for pap & physical or earlier if needed  Marge DuncansBooker, Zaela Graley Randall CNM, [facility] 09/20/2014 11:15 AM

## 2014-09-20 NOTE — Patient Instructions (Signed)
Recommended to wait at least 18 months between births, so can try for another pregnancy in about 9 months if you would like

## 2019-02-12 ENCOUNTER — Other Ambulatory Visit (HOSPITAL_COMMUNITY): Payer: Self-pay | Admitting: Occupational Medicine

## 2019-02-12 ENCOUNTER — Ambulatory Visit (HOSPITAL_COMMUNITY)
Admission: RE | Admit: 2019-02-12 | Discharge: 2019-02-12 | Disposition: A | Discharge status: Home / Self Care | Payer: Self-pay | Source: Ambulatory Visit | Attending: Occupational Medicine | Admitting: Occupational Medicine

## 2019-02-12 DIAGNOSIS — R7612 Nonspecific reaction to cell mediated immunity measurement of gamma interferon antigen response without active tuberculosis: Secondary | ICD-10-CM

## 2020-11-16 IMAGING — CR DG CHEST 2V
2 series · 2 of 2 positions shown · non-contrast
Comparison: None.

CLINICAL DATA: Positive TB skin test.

EXAM:
CHEST - 2 VIEW

[w chest pa]
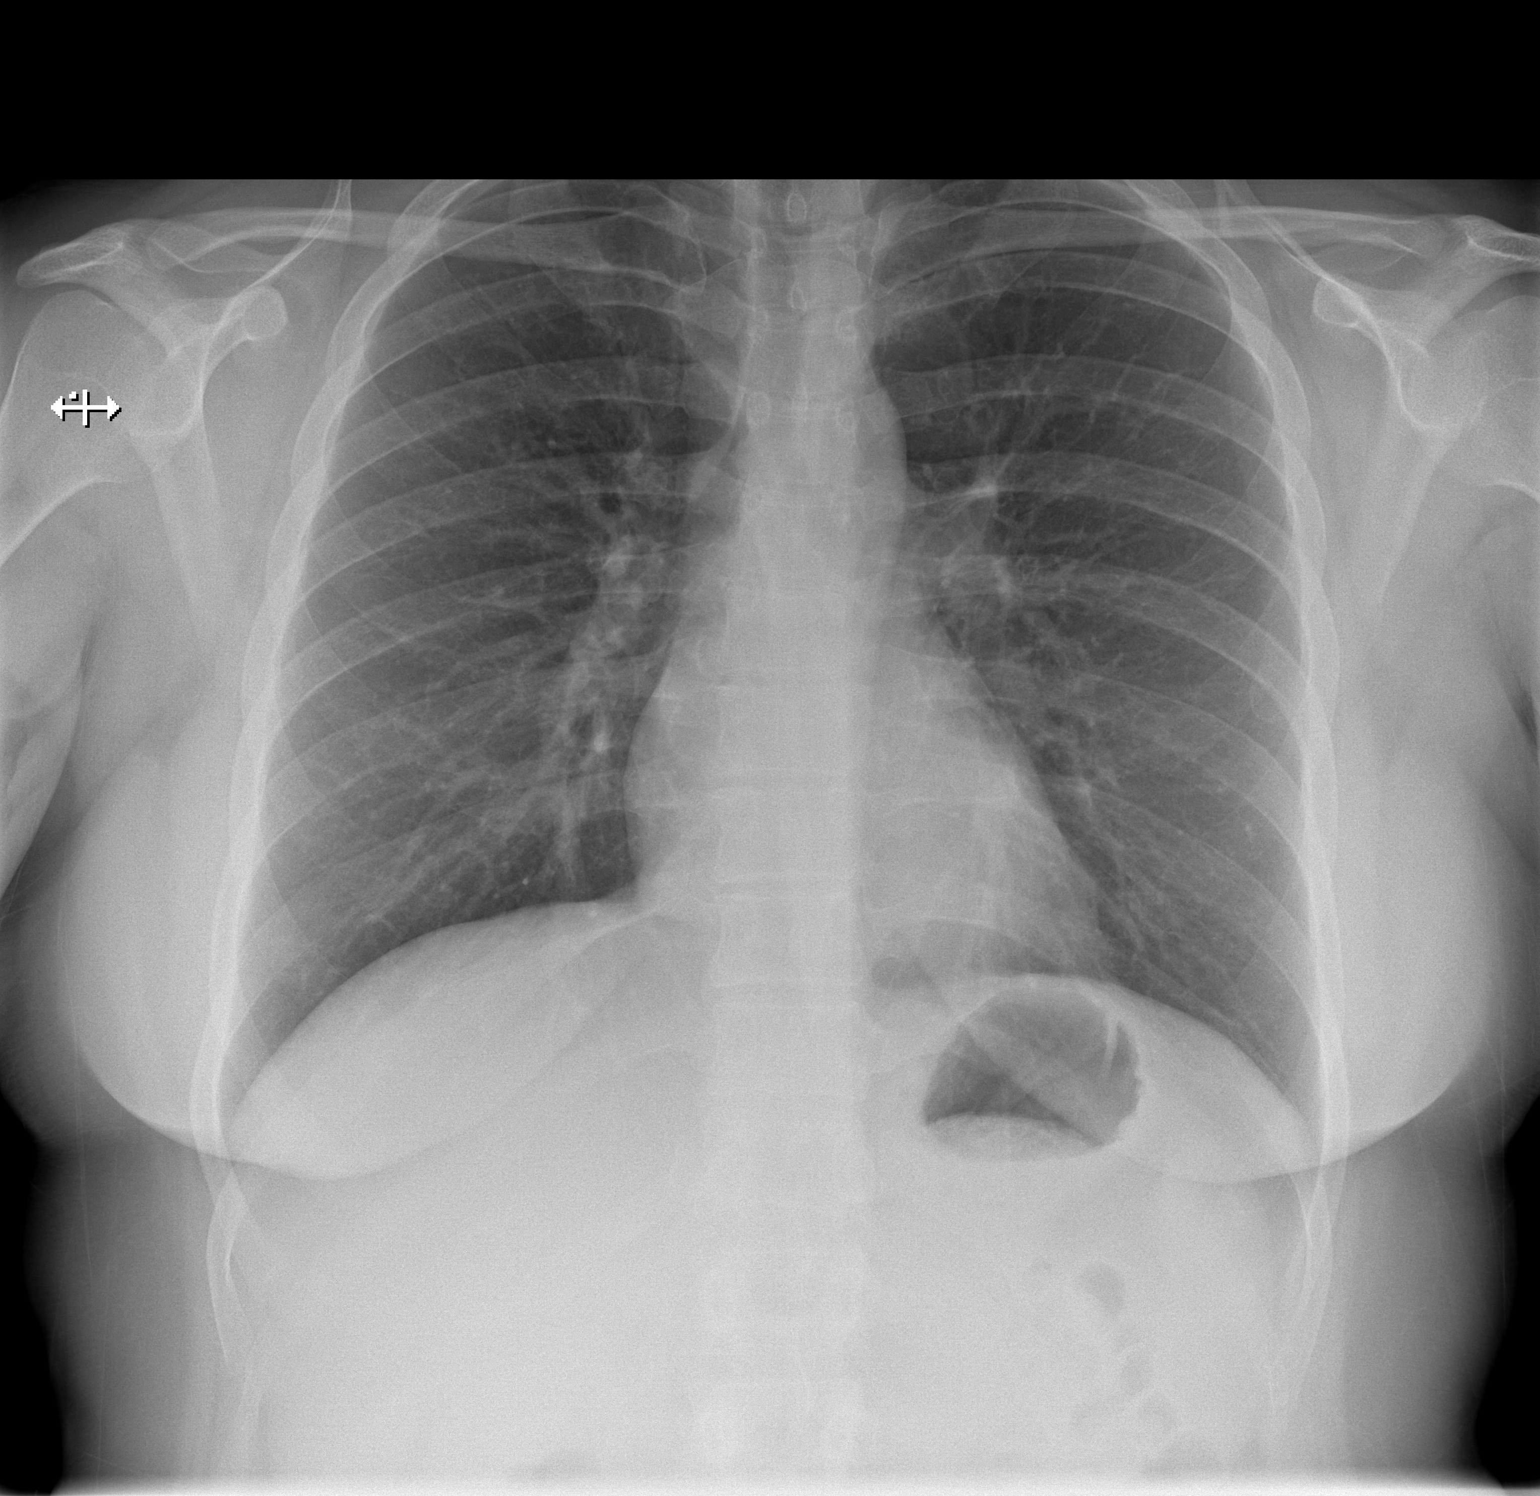

[w chest lat]
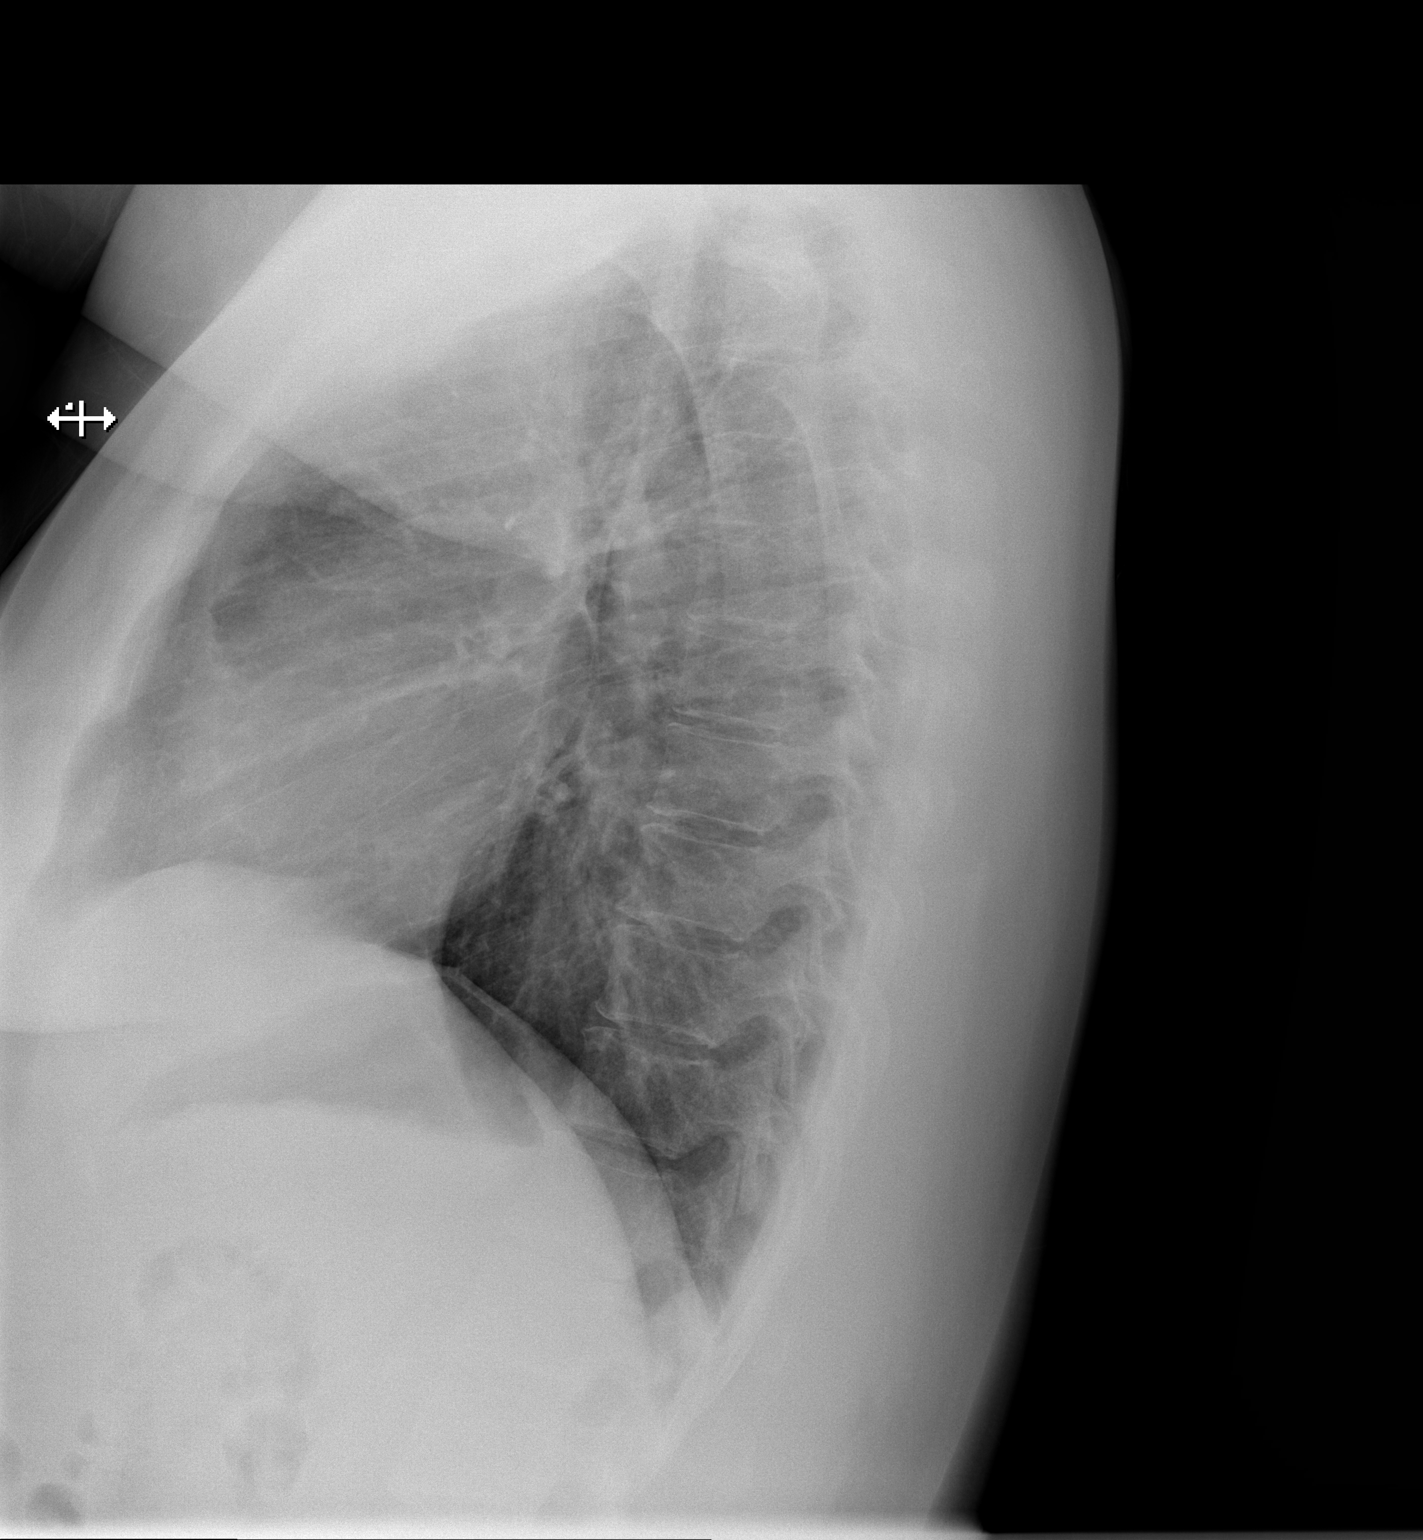

[2 of 2 positions shown; findings below may reference images not displayed]

FINDINGS: Normal sized heart. Clear lungs. Minimal scoliosis. Minimal thoracic
spine degenerative changes.
IMPRESSION: No acute abnormality. No evidence of active or previous tuberculosis
infection.

## 2021-02-17 ENCOUNTER — Ambulatory Visit
Admission: RE | Admit: 2021-02-17 | Discharge: 2021-02-17 | Disposition: A | Discharge status: Home / Self Care | Payer: Managed Care, Other (non HMO) | Source: Ambulatory Visit | Attending: Urgent Care | Admitting: Urgent Care

## 2021-02-17 VITALS — BP 119/82 | HR 82 | Temp 99.0°F | Resp 20

## 2021-02-17 DIAGNOSIS — R0981 Nasal congestion: Secondary | ICD-10-CM

## 2021-02-17 DIAGNOSIS — J069 Acute upper respiratory infection, unspecified: Secondary | ICD-10-CM | POA: Diagnosis not present

## 2021-02-17 DIAGNOSIS — R070 Pain in throat: Secondary | ICD-10-CM

## 2021-02-17 LAB — POCT RAPID STREP A (OFFICE): Rapid Strep A Screen: NEGATIVE

## 2021-02-17 MED ORDER — PROMETHAZINE-DM 6.25-15 MG/5ML PO SYRP: 5.0000 mL | ORAL_SOLUTION | Freq: Every evening | ORAL | 0 refills | Status: AC | PRN

## 2021-02-17 MED ORDER — CETIRIZINE HCL 10 MG PO TABS: 10.0000 mg | ORAL_TABLET | Freq: Every day | ORAL | 0 refills | Status: AC

## 2021-02-17 MED ORDER — BENZONATATE 100 MG PO CAPS: 100.0000 mg | ORAL_CAPSULE | Freq: Three times a day (TID) | ORAL | 0 refills | Status: AC | PRN

## 2021-02-17 MED ORDER — PSEUDOEPHEDRINE HCL 60 MG PO TABS: 60.0000 mg | ORAL_TABLET | Freq: Three times a day (TID) | ORAL | 0 refills | Status: AC | PRN

## 2021-02-17 NOTE — ED Provider Notes (Signed)
[facility]   MRN: [id number] DOB: [birth year]  Subjective:   Bonnie Mayer is a 30 y.o. female presenting for 2-day history of sinus congestion, bilateral ear fullness and decreased hearing, throat pain, cough.  Patient would like to be checked for strep and COVID.  No chest pain, shortness of breath, wheezing.  No current facility-administered medications for this encounter.  Current Outpatient Medications:    ibuprofen (ADVIL,MOTRIN) 600 MG tablet, Take 1 tablet (600 mg total) by mouth every 6 (six) hours. (Patient not taking: Reported on 09/20/2014), Disp: 50 tablet, Rfl: 1   Prenatal Vit-Fe Fumarate-FA (MULTIVITAMIN-PRENATAL) 27-0.8 MG TABS tablet, Take 1 tablet by mouth daily at 12 noon., Disp: 30 each, Rfl: 11   No Known Allergies  Past Medical History:  Diagnosis Date   Medical history non-contributory      Past Surgical History:  Procedure Laterality Date   NO PAST SURGERIES      Family History  Problem Relation Age of Onset   Diabetes Mother    Hyperlipidemia Mother    Hypertension Mother    Cancer Paternal Uncle    Alzheimer's disease Maternal Grandmother    Diabetes Maternal Grandmother    Hyperlipidemia Maternal Grandmother    Hypertension Maternal Grandmother    Diabetes Maternal Grandfather    Hyperlipidemia Maternal Grandfather    Hypertension Maternal Grandfather    Cancer Paternal Grandmother        breast    Social History   Tobacco Use   Smoking status: Never   Smokeless tobacco: Never  Substance Use Topics   Alcohol use: No   Drug use: No    ROS   Objective:   Vitals: BP 119/82   Pulse 82   Temp 99 F (37.2 C)   Resp 20   LMP 02/16/2021   SpO2 96%   Physical Exam Constitutional:      General: She is not in acute distress.    Appearance: Normal appearance. She is well-developed. She is not ill-appearing, toxic-appearing or diaphoretic.  HENT:     Head: Normocephalic and atraumatic.     Right Ear: Tympanic  membrane and ear canal normal. No drainage or tenderness. No middle ear effusion. Tympanic membrane is not erythematous.     Left Ear: Tympanic membrane and ear canal normal. No drainage or tenderness.  No middle ear effusion. Tympanic membrane is not erythematous.     Nose: Nose normal. No congestion or rhinorrhea.     Mouth/Throat:     Mouth: Mucous membranes are moist. No oral lesions.     Pharynx: No pharyngeal swelling, oropharyngeal exudate, posterior oropharyngeal erythema or uvula swelling.     Tonsils: No tonsillar exudate or tonsillar abscesses. 0 on the right. 0 on the left.  Eyes:     Extraocular Movements: Extraocular movements intact.     Right eye: Normal extraocular motion.     Left eye: Normal extraocular motion.     Conjunctiva/sclera: Conjunctivae normal.     Pupils: Pupils are equal, round, and reactive to light.  Cardiovascular:     Rate and Rhythm: Normal rate and regular rhythm.     Pulses: Normal pulses.     Heart sounds: Normal heart sounds. No murmur heard.   No friction rub. No gallop.  Pulmonary:     Effort: Pulmonary effort is normal. No respiratory distress.     Breath sounds: Normal breath sounds. No stridor. No wheezing, rhonchi or rales.  Musculoskeletal:     Cervical back: Normal  range of motion and neck supple.  Lymphadenopathy:     Cervical: No cervical adenopathy.  Skin:    General: Skin is warm and dry.     Findings: No rash.  Neurological:     General: No focal deficit present.     Mental Status: She is alert and oriented to person, place, and time.  Psychiatric:        Mood and Affect: Mood normal.        Behavior: Behavior normal.        Thought Content: Thought content normal.    Results for orders placed or performed during the hospital encounter of 02/17/21 (from the past 24 hour(s))  POCT rapid strep A     Status: None   Collection Time: 02/17/21  3:17 PM  Result Value Ref Range   Rapid Strep A Screen Negative Negative     Assessment and Plan :   PDMP not reviewed this encounter.  1. Viral URI with cough   2. Throat pain   3. Nasal congestion    Will manage for viral illness such as viral URI, viral syndrome, viral rhinitis, COVID-19, viral pharyngitis. Counseled patient on nature of COVID-19 including modes of transmission, diagnostic testing, management and supportive care.  Offered scripts for symptomatic relief. COVID 19 and strep culture are pending. Counseled patient on potential for adverse effects with medications prescribed/recommended today, ER and return-to-clinic precautions discussed, patient verbalized understanding.     Wallis Bamberg, [place] 02/17/21 0277

## 2021-02-17 NOTE — ED Triage Notes (Signed)
Pt presents with c/o sore throat and cough that began 2 days ago  

## 2021-02-18 LAB — SARS-COV-2, NAA 2 DAY TAT

## 2021-02-18 LAB — NOVEL CORONAVIRUS, NAA: SARS-CoV-2, NAA: NOT DETECTED
